# Patient Record
Sex: Female | Born: 2000 | Race: White | Hispanic: No | Marital: Single | Smoking: Never smoker
Health system: Southern US, Community
[De-identification: ages and names within clinical notes are randomized; demographics above are authoritative.]

## PROBLEM LIST (undated history)

## (undated) ENCOUNTER — Ambulatory Visit (HOSPITAL_COMMUNITY)

## (undated) DIAGNOSIS — N390 Urinary tract infection, site not specified: Secondary | ICD-10-CM

## (undated) DIAGNOSIS — J302 Other seasonal allergic rhinitis: Secondary | ICD-10-CM

## (undated) DIAGNOSIS — N83209 Unspecified ovarian cyst, unspecified side: Secondary | ICD-10-CM

## (undated) DIAGNOSIS — R519 Headache, unspecified: Secondary | ICD-10-CM

## (undated) DIAGNOSIS — F32A Depression, unspecified: Secondary | ICD-10-CM

## (undated) DIAGNOSIS — G43909 Migraine, unspecified, not intractable, without status migrainosus: Secondary | ICD-10-CM

## (undated) HISTORY — DX: Headache, unspecified: R51.9

## (undated) HISTORY — DX: Urinary tract infection, site not specified: N39.0

## (undated) HISTORY — PX: TONSILLECTOMY: SUR1361

## (undated) HISTORY — DX: Migraine, unspecified, not intractable, without status migrainosus: G43.909

## (undated) HISTORY — DX: Depression, unspecified: F32.A

---
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
## 2021-02-23 IMAGING — US US PELVIS COMPLETE WITH TRANSVAGINAL
1 series · 13 of 25 positions shown · non-contrast
Comparison: None

CLINICAL DATA: Heavy vaginal bleeding.



[Series 1: us pelvis complete with transvaginal · 13 of 83 slices shown]
[im 1/83]
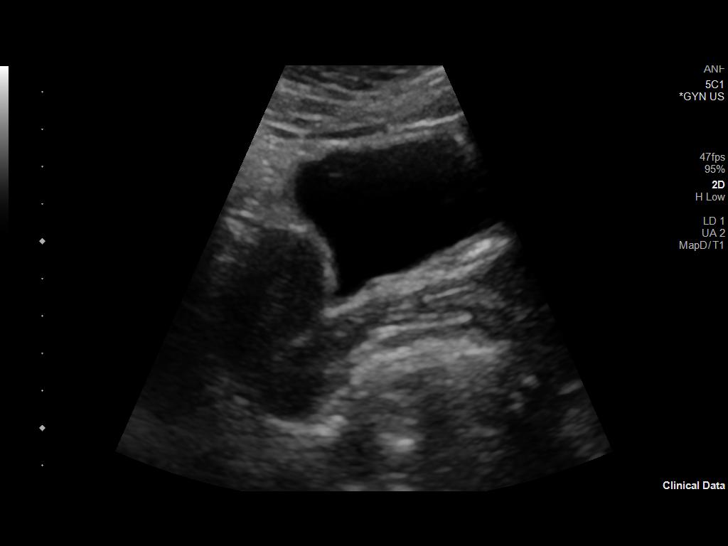
[im 7/83]
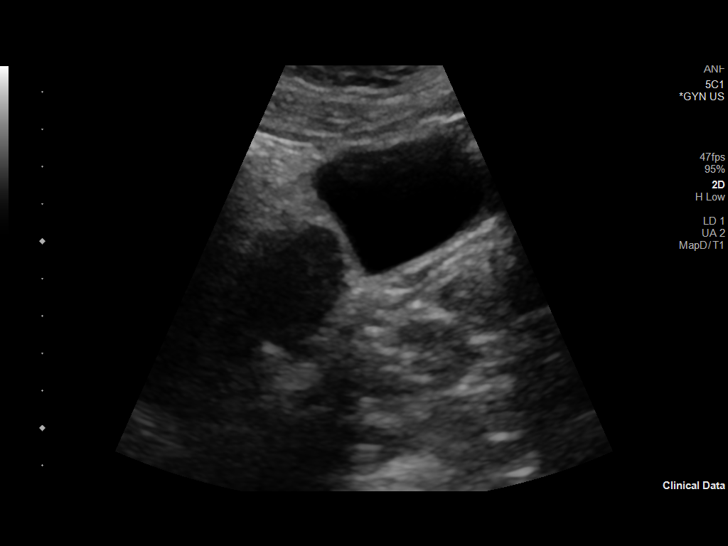
[im 14/83]
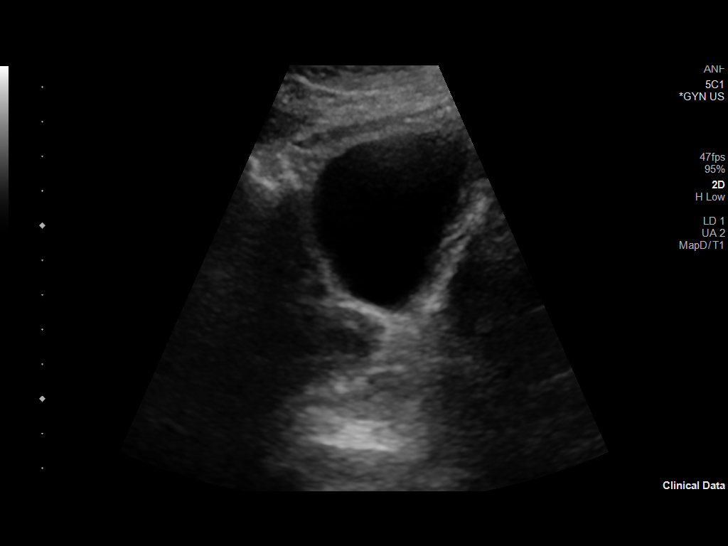
[im 21/83]
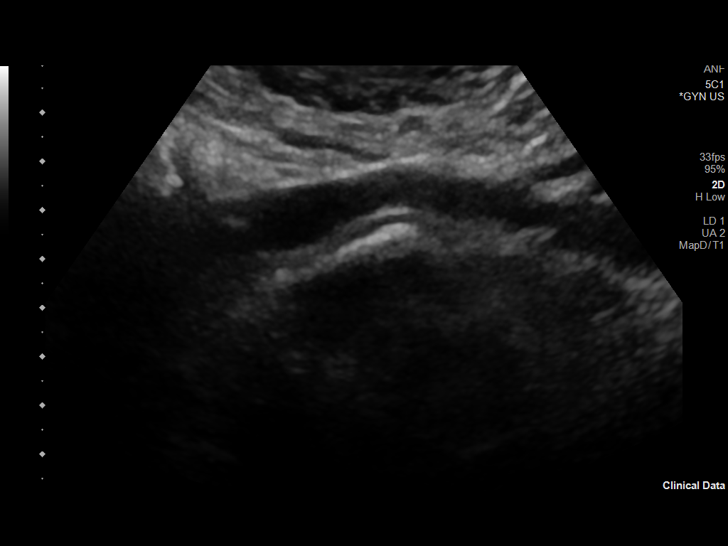
[im 28/83]
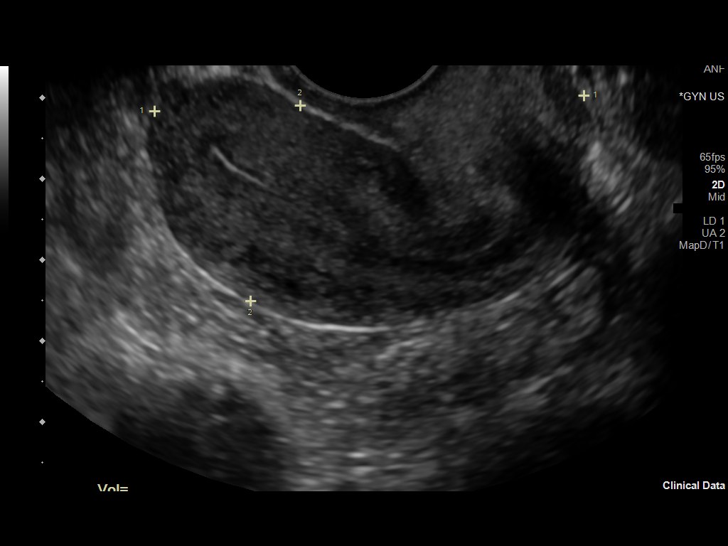
[im 35/83]
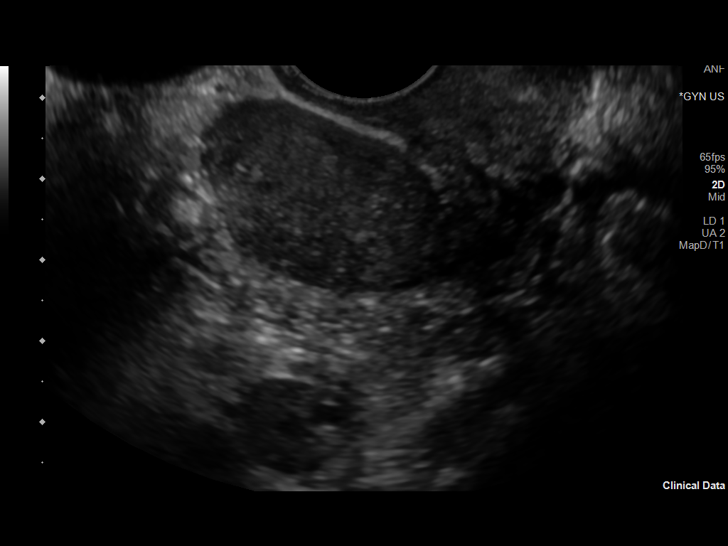
[im 42/83]
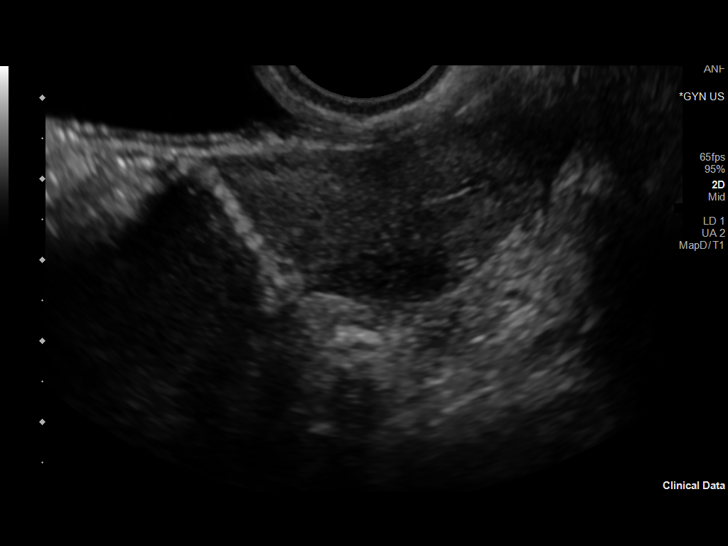
[im 48/83]
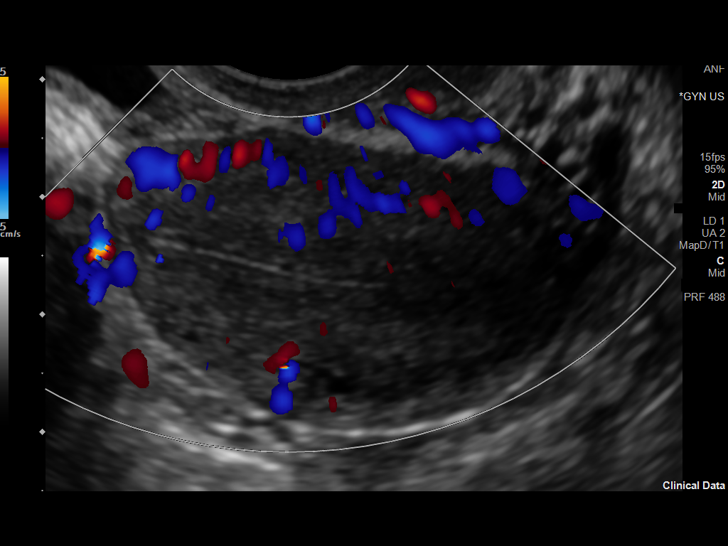
[im 55/83]
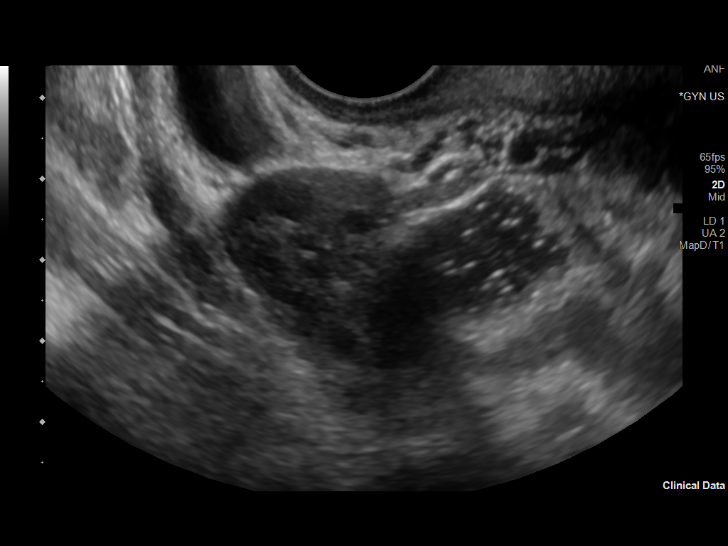
[im 62/83]
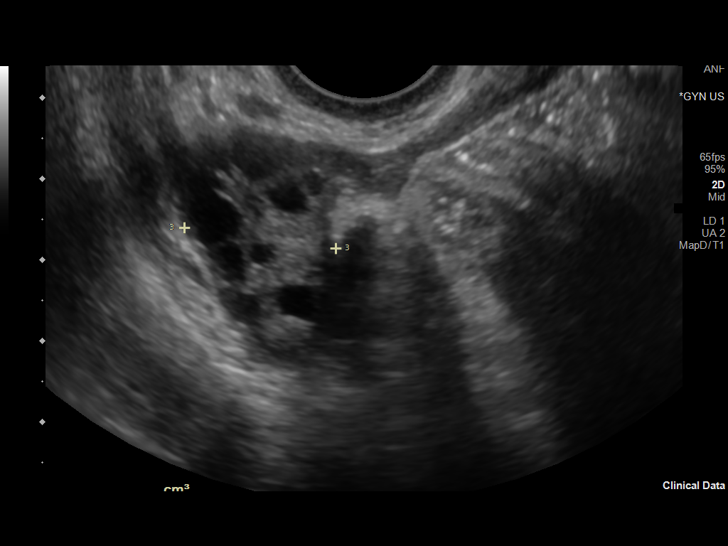
[im 69/83]
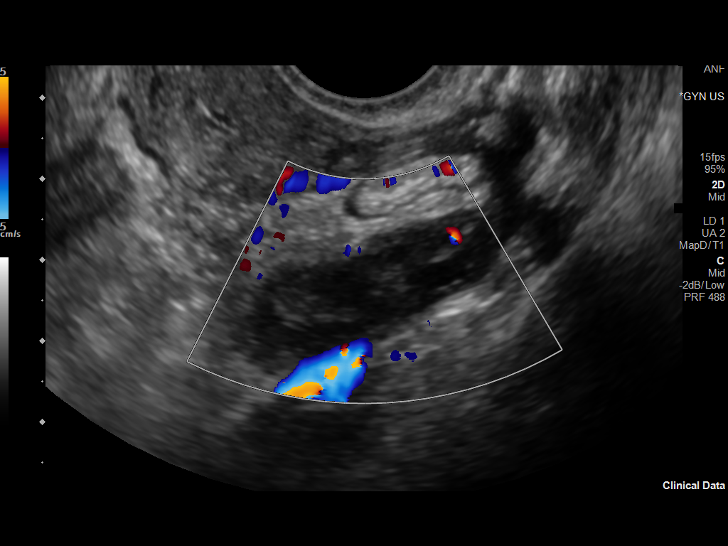
[im 76/83]
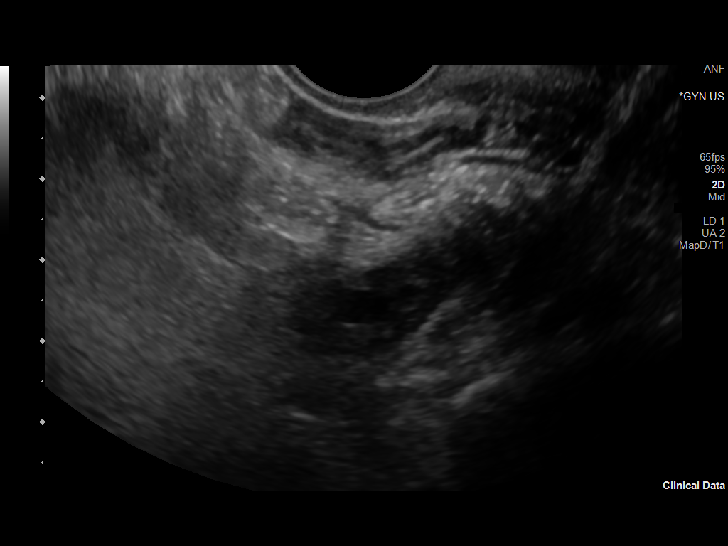
[im 83/83]
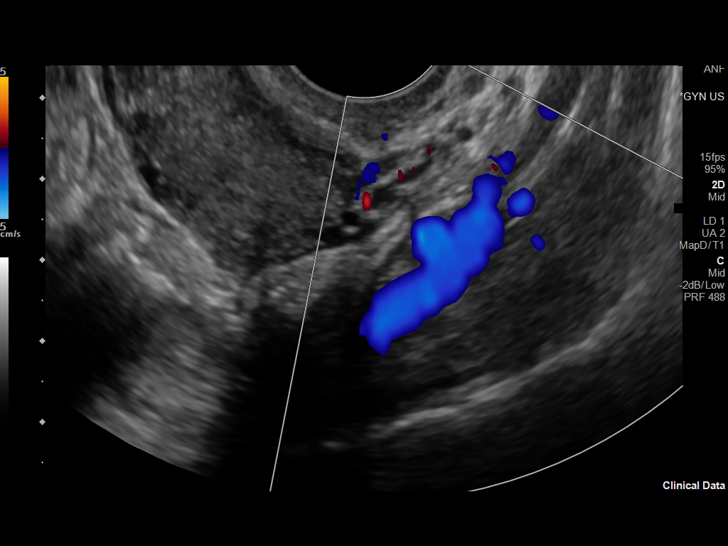

[13 of 25 positions shown; findings below may reference images not displayed]

FINDINGS: Uterus

Measurements: 5.3 x 2.5 x 4.2 cm = volume: 28.8 mL. No fibroids or
other mass visualized.

Endometrium

Thickness: 1 mm.  No focal abnormality visualized.

Right ovary

Measurements: 3.4 x 2.2 x 1.9 cm = volume: 7.5 mL. Normal
appearance/no adnexal mass.

Left ovary

Measurements: 3.8 x 1.1 x 2.0 cm = volume: 4.2 mL. Normal
appearance/no adnexal mass.

Other findings

No abnormal free fluid.
IMPRESSION: Endometrium measures 1 mm. If bleeding remains unresponsive to
hormonal or medical therapy, sonohysterogram should be considered
for focal lesion work-up. (Ref: Radiological Reasoning: Algorithmic
Workup of Abnormal Vaginal Bleeding with Endovaginal Sonography and
Sonohysterography. [id number]; [id number]:[id number])

## 2021-10-10 DIAGNOSIS — H5213 Myopia, bilateral: Secondary | ICD-10-CM | POA: Diagnosis not present

## 2021-10-11 ENCOUNTER — Encounter: Payer: Self-pay | Admitting: Family

## 2021-10-11 ENCOUNTER — Ambulatory Visit (INDEPENDENT_AMBULATORY_CARE_PROVIDER_SITE_OTHER): Payer: Medicaid Other | Admitting: Family

## 2021-10-11 VITALS — BP 108/64 | HR 87 | Temp 97.9°F | Resp 16 | Ht 62.0 in | Wt 138.0 lb

## 2021-10-11 DIAGNOSIS — Z13220 Encounter for screening for lipoid disorders: Secondary | ICD-10-CM | POA: Insufficient documentation

## 2021-10-11 DIAGNOSIS — F4323 Adjustment disorder with mixed anxiety and depressed mood: Secondary | ICD-10-CM

## 2021-10-11 DIAGNOSIS — N946 Dysmenorrhea, unspecified: Secondary | ICD-10-CM | POA: Insufficient documentation

## 2021-10-11 DIAGNOSIS — D509 Iron deficiency anemia, unspecified: Secondary | ICD-10-CM | POA: Insufficient documentation

## 2021-10-11 DIAGNOSIS — R519 Headache, unspecified: Secondary | ICD-10-CM

## 2021-10-11 DIAGNOSIS — N921 Excessive and frequent menstruation with irregular cycle: Secondary | ICD-10-CM | POA: Insufficient documentation

## 2021-10-11 DIAGNOSIS — F319 Bipolar disorder, unspecified: Secondary | ICD-10-CM | POA: Diagnosis not present

## 2021-10-11 DIAGNOSIS — R5383 Other fatigue: Secondary | ICD-10-CM

## 2021-10-11 DIAGNOSIS — B079 Viral wart, unspecified: Secondary | ICD-10-CM

## 2021-10-11 DIAGNOSIS — D500 Iron deficiency anemia secondary to blood loss (chronic): Secondary | ICD-10-CM | POA: Diagnosis not present

## 2021-10-11 LAB — CBC WITH DIFFERENTIAL/PLATELET
Basophils Absolute: 0 10*3/uL (ref 0.0–0.1)
Basophils Relative: 0.5 % (ref 0.0–3.0)
Eosinophils Absolute: 0 10*3/uL (ref 0.0–0.7)
Eosinophils Relative: 0.8 % (ref 0.0–5.0)
HCT: 36.4 % (ref 36.0–49.0)
Hemoglobin: 12.2 g/dL (ref 12.0–16.0)
Lymphocytes Relative: 35.8 % (ref 24.0–48.0)
Lymphs Abs: 2.2 10*3/uL (ref 0.7–4.0)
MCHC: 33.5 g/dL (ref 31.0–37.0)
MCV: 89.7 fl (ref 78.0–98.0)
Monocytes Absolute: 0.6 10*3/uL (ref 0.1–1.0)
Monocytes Relative: 9 % (ref 3.0–12.0)
Neutro Abs: 3.4 10*3/uL (ref 1.4–7.7)
Neutrophils Relative %: 53.9 % (ref 43.0–71.0)
Platelets: 267 10*3/uL (ref 150.0–575.0)
RBC: 4.06 Mil/uL (ref 3.80–5.70)
RDW: 14.7 % (ref 11.4–15.5)
WBC: 6.3 10*3/uL (ref 4.5–13.5)

## 2021-10-11 LAB — IBC + FERRITIN
Ferritin: 8.5 ng/mL — ABNORMAL LOW (ref 10.0–291.0)
Iron: 86 ug/dL (ref 42–145)
Saturation Ratios: 18.6 % — ABNORMAL LOW (ref 20.0–50.0)
TIBC: 462 ug/dL — ABNORMAL HIGH (ref 250.0–450.0)
Transferrin: 330 mg/dL (ref 212.0–360.0)

## 2021-10-11 LAB — TSH: TSH: 2.37 u[IU]/mL (ref 0.40–5.00)

## 2021-10-11 LAB — LIPID PANEL
Cholesterol: 168 mg/dL (ref 0–200)
HDL: 67.9 mg/dL (ref >39.00)
LDL Cholesterol: 89 mg/dL (ref 0–99)
NonHDL: 99.83
Total CHOL/HDL Ratio: 2
Triglycerides: 54 mg/dL (ref 0.0–149.0)
VLDL: 10.8 mg/dL (ref 0.0–40.0)

## 2021-10-11 LAB — COMPREHENSIVE METABOLIC PANEL
ALT: 16 U/L (ref 0–35)
AST: 20 U/L (ref 0–37)
Albumin: 4.7 g/dL (ref 3.5–5.2)
Alkaline Phosphatase: 47 U/L (ref 47–119)
BUN: 9 mg/dL (ref 6–23)
CO2: 26 mEq/L (ref 19–32)
Calcium: 9.8 mg/dL (ref 8.4–10.5)
Chloride: 105 mEq/L (ref 96–112)
Creatinine, Ser: 0.7 mg/dL (ref 0.40–1.20)
GFR: 124.89 mL/min (ref >60.00)
Glucose, Bld: 89 mg/dL (ref 70–99)
Potassium: 4.1 mEq/L (ref 3.5–5.1)
Sodium: 138 mEq/L (ref 135–145)
Total Bilirubin: 0.4 mg/dL (ref 0.2–1.2)
Total Protein: 7.1 g/dL (ref 6.0–8.3)

## 2021-10-11 LAB — SEDIMENTATION RATE: Sed Rate: 2 mm/hr (ref 0–20)

## 2021-10-11 LAB — B12 AND FOLATE PANEL
Folate: 13 ng/mL (ref >5.9)
Vitamin B-12: 350 pg/mL (ref 211–911)

## 2021-10-11 LAB — C-REACTIVE PROTEIN: CRP: <1 mg/dL (ref 0.5–20.0)

## 2021-10-11 MED ORDER — HYDROXYZINE HCL 10 MG PO TABS: 10.0000 mg | ORAL_TABLET | Freq: Three times a day (TID) | ORAL | 0 refills | Status: DC | PRN

## 2021-10-11 MED ORDER — FLUOXETINE HCL 20 MG PO CAPS: 20.0000 mg | ORAL_CAPSULE | Freq: Every day | ORAL | 2 refills | Status: DC

## 2021-10-11 MED ORDER — FLUOXETINE HCL 10 MG PO CAPS: 20.0000 mg | ORAL_CAPSULE | Freq: Every day | ORAL | 0 refills | Status: DC

## 2021-10-11 NOTE — Assessment & Plan Note (Signed)
Lipid panel ordered pending results.   

## 2021-10-11 NOTE — Assessment & Plan Note (Signed)
Fatigue workup with labs pending results 

## 2021-10-11 NOTE — Progress Notes (Signed)
New Patient Office Visit  Subjective:  Patient ID: Teresa Rogers, female    DOB: [birth year]  Age: 21 y.o. MRN: [id number]  CC:  Chief Complaint  Patient presents with   Establish Care    HPI Teresa Rogers is here to establish care as a new patient.  Prior provider was: Ruben Gottron, 2020  Pt is without acute concerns.   chronic concerns:  Depression,adjustment disorder: prozac 20 mg, stopped this, stopped around December 2022. She felt numb while taking this, didn't 'feel' anything. Was not suicidal denies SI HI  Had been going to therapy, no longer going. Had a lapse with treatment due to a therapist leaving and she never got rescheduled. She does feel this was helping.   Was using hydroxyxine in the past for anxiety attacks was helpful  Gets warts on bil hands, has been using otc treatment for this. Wants to get treated. Do not itch.   Headaches, only drinks water at night. Has poor eating habits will eat at times and then finds she doesn't eat for a few days. Drinks a lot of dr pepper as well.   Past Medical History:  Diagnosis Date   Depression    Frequent headaches    Migraines    Ovarian cyst    Seasonal allergies    Urinary tract infection    UTI (urinary tract infection)     Past Surgical History:  Procedure Laterality Date   TONSILLECTOMY      Family History  Problem Relation Age of Onset   Depression Mother    Bipolar disorder Father    Depression Father    Alcohol abuse Father        in rehab   Depression Maternal Grandmother        Suicide attempt with zoloft   Cancer Maternal Grandfather        unsure which   Cancer Paternal Grandfather        unsure which    Social History   Socioeconomic History   Marital status: Single    Spouse name: Not on file   Number of children: Not on file   Years of education: Not on file   Highest education level: Not on file  Occupational History   Occupation: [employer]    Comment: working right now   Tobacco Use   Smoking status: Never   Smokeless tobacco: Never  Vaping Use   Vaping Use: Every day   Start date: 10/08/2018   Substances: Nicotine  Substance and Sexual Activity   Alcohol use: Not Currently   Drug use: Never   Sexual activity: Yes    Partners: Male    Birth control/protection: Condom    Comment: same partner since 8th grade  Other Topics Concern   Not on file  Social History Narrative   Several half-siblings- 2 on mom's side, 5 on dad's side   Boyfriend's mom has legal guardianship      Not a good relationship with parents    Social Determinants of Health   Financial Resource Strain: Not on file  Food Insecurity: Not on file  Transportation Needs: Not on file  Physical Activity: Not on file  Stress: Not on file  Social Connections: Not on file  Intimate Partner Violence: Not on file    Outpatient Medications Prior to Visit  Medication Sig Dispense Refill   FLUoxetine (PROZAC) 20 MG capsule Take 1 capsule (20 mg total) by mouth daily. 30 capsule 2   hydrOXYzine (  ATARAX/VISTARIL) 10 MG tablet Take 1 tablet (10 mg total) by mouth 3 (three) times daily as needed for anxiety or nausea. (Patient not taking: Reported on 10/11/2021) 30 tablet 0   ibuprofen (ADVIL) 200 MG tablet Take 400 mg by mouth daily as needed for headache or cramping. (Patient not taking: Reported on 10/11/2021)     norgestimate-ethinyl estradiol (SPRINTEC 28) 0.25-35 MG-MCG tablet TUD (Patient not taking: Reported on 10/11/2021) 1 Package 11   ondansetron (ZOFRAN ODT) 4 MG disintegrating tablet Take 1 tablet (4 mg total) by mouth every 8 (eight) hours as needed for nausea or vomiting. (Patient not taking: Reported on 10/11/2021) 10 tablet 0   polyethylene glycol powder (GLYCOLAX/MIRALAX) 17 GM/SCOOP powder Take 17 g by mouth daily. Mix with 8oz of liquid with 17g miralax and take once daily. (Patient not taking: Reported on 02/23/2019) 500 g 0   No facility-administered medications prior to  visit.    Allergies  Allergen Reactions   Depo-Provera [Medroxyprogesterone Acetate] Other (See Comments)    Heavy periods heavier than usual causing anemia   Latex Rash        Objective:    Physical Exam  Gen: NAD, resting comfortably CV: RRR with no murmurs appreciated Pulm: NWOB, CTAB with no crackles, wheezes, or rhonchi Skin: warm, dry Psych: Normal affect and thought content  BP 108/64   Pulse 87   Temp 97.9 F (36.6 C)   Resp 16   Ht 5\' 2"  (1.575 m)   Wt 138 lb (62.6 kg)   LMP 10/10/2021   SpO2 97%   BMI 25.24 kg/m  Wt Readings from Last 3 Encounters:  10/11/21 138 lb (62.6 kg) (66 %, Z= 0.41)*  02/23/19 115 lb 11.9 oz (52.5 kg) (36 %, Z= -0.37)*  08/26/18 110 lb 6.4 oz (50.1 kg) (27 %, Z= -0.63)*   * Growth percentiles are based on CDC (Girls, 2-20 Years) data.     Health Maintenance Due  Topic Date Due   HPV VACCINES (2 - 3-dose series) 05/17/2018   Hepatitis C Screening  Never done   CHLAMYDIA SCREENING  02/23/2020       Topic Date Due   HPV VACCINES (2 - 3-dose series) 05/17/2018    No results found for: "TSH" Lab Results  Component Value Date   WBC 5.3 02/23/2019   HGB 12.8 02/23/2019   HCT 39.0 02/23/2019   MCV 94.7 02/23/2019   PLT 283 02/23/2019   Lab Results  Component Value Date   NA 141 08/26/2018   K 4.1 08/26/2018   CO2 25 08/26/2018   GLUCOSE 75 08/26/2018   BUN 7 08/26/2018   CREATININE 0.70 08/26/2018   BILITOT 0.4 08/26/2018   AST 20 08/26/2018   ALT 11 08/26/2018   PROT 7.4 08/26/2018   CALCIUM 10.2 08/26/2018   No results found for: "CHOL" No results found for: "HDL" No results found for: "LDLCALC" No results found for: "TRIG" No results found for: "CHOLHDL" No results found for: "HGBA1C"    Assessment & Plan:   Problem List Items Addressed This Visit       Musculoskeletal and Integument   Wart of hand    Referred to dermatology Likely will need frozen off      Relevant Orders   Ambulatory  referral to Dermatology     Genitourinary   Painful menstrual periods    Has tried ocps didn't like this method Can take ibuprofen prior to onset to see if this helps Ordering cbc  to assess for anemia      Relevant Orders   TSH     Other   Menorrhagia with irregular cycle    May consider workup for pcos in near future  Pending results of tsh and cbc      Iron deficiency anemia due to chronic blood loss    ibc ferritin and cbc ordered pending results Likely result of heavier than normal periods      Relevant Orders   B12 and Folate Panel   IBC + Ferritin   CBC with Differential   Other fatigue    Fatigue workup with labs pending results      Relevant Orders   TSH   B12 and Folate Panel   IBC + Ferritin   CBC with Differential   Comprehensive metabolic panel   Intractable episodic headache    Likely r/t diet and lack of water  Increase oral water intake to 4-6 bottles a day  Decrease soda intake and caffeine if taking.  Try to eat every 2-3 hours don't skip meals.  Keep a headache diary, avoid triggers as able.  Ordering tsh cbc cmp crp and sed rate      Relevant Medications   FLUoxetine (PROZAC) 10 MG capsule   Other Relevant Orders   C-reactive protein   Sedimentation rate   Adjustment disorder with mixed anxiety and depressed mood    Will again try prozac 10 mg once daily  Handout given for therapy, also referred pt to psychiatry as she feels she may have bipolar components  Work on anxiety reducing techniques  rx hydroxyzine 50 mg prn  Refill given  I instructed pt to start prozac 10 mg 1/2 tablet once daily for 1 week and then increase to a full tablet once daily on week two as tolerated.  We discussed common side effects such as nausea, drowsiness and weight gain.  Also discussed rare but serious side effect of suicidal ideation.  She is instructed to discontinue medication and go directly to ED if this occurs.  Pt verbalizes understanding.  Plan is to  follow up in 30 days to evaluate progress.          Relevant Medications   hydrOXYzine (ATARAX) 10 MG tablet   FLUoxetine (PROZAC) 10 MG capsule   Other Relevant Orders   TSH   Ambulatory referral to Psychiatry   Screening for lipoid disorders    Lipid panel ordered pending results.        Relevant Orders   Lipid panel   RESOLVED: Bipolar disorder with depression (HCC) - Primary    Meds ordered this encounter  Medications   hydrOXYzine (ATARAX) 10 MG tablet    Sig: Take 1 tablet (10 mg total) by mouth 3 (three) times daily as needed for anxiety or nausea.    Dispense:  30 tablet    Refill:  0    Order Specific Question:   Supervising Provider    Answer:   BEDSOLE, AMY E [[id number]]   DISCONTD: FLUoxetine (PROZAC) 20 MG capsule    Sig: Take 1 capsule (20 mg total) by mouth daily.    Dispense:  30 capsule    Refill:  2    Order Specific Question:   Supervising Provider    Answer:   BEDSOLE, AMY E [[id number]]   FLUoxetine (PROZAC) 10 MG capsule    Sig: Take 2 capsules (20 mg total) by mouth daily.    Dispense:  120 capsule    Refill:  0    Order Specific Question:   Supervising Provider    Answer:   Ermalene Searing AMY E [[id number]]    Follow-up: Return in about 1 month (around 11/11/2021) for regular follow up for medication .    Mort Sawyers, FNP

## 2021-10-11 NOTE — Assessment & Plan Note (Signed)
May consider workup for pcos in near future  Pending results of tsh and cbc

## 2021-10-11 NOTE — Assessment & Plan Note (Signed)
Will again try prozac 10 mg once daily  Handout given for therapy, also referred pt to psychiatry as she feels she may have bipolar components  Work on anxiety reducing techniques  rx hydroxyzine 50 mg prn  Refill given  I instructed pt to start prozac 10 mg 1/2 tablet once daily for 1 week and then increase to a full tablet once daily on week two as tolerated.  We discussed common side effects such as nausea, drowsiness and weight gain.  Also discussed rare but serious side effect of suicidal ideation.  She is instructed to discontinue medication and go directly to ED if this occurs.  Pt verbalizes understanding.  Plan is to follow up in 30 days to evaluate progress.

## 2021-10-11 NOTE — Assessment & Plan Note (Signed)
ibc ferritin and cbc ordered pending results Likely result of heavier than normal periods

## 2021-10-11 NOTE — Patient Instructions (Addendum)
  Welcome to our clinic, I am happy to have you as my new patient. I am excited to continue on this healthcare journey with you.  Stop by the lab prior to leaving today. I will notify you of your results once received.   Start prozac 10 mg for anxiety and depression. Take 1/2 tablet by mouth once daily for about one week, then increase to 1 full tablet thereafter.   Taking the medicine as directed and not missing any doses is one of the best things you can do to treat your anxiety/depression.  Here are some things to keep in mind:  Side effects (stomach upset, some increased anxiety) may happen before you notice a benefit.  These side effects typically go away over time. Changes to your dose of medicine or a change in medication all together is sometimes necessary Many people will notice an improvement within two weeks but the full effect of the medication can take up to 4-6 weeks Stopping the medication when you start feeling better often results in a return of symptoms. Most people need to be on medication at least 6-12 months If you start having thoughts of hurting yourself or others after starting this medicine, please call me immediately.     Please keep in mind Any my chart messages you send have up to a three business day turnaround for a response.  Phone calls may take up to a one full business day turnaround for a  response.   If you need a medication refill I recommend you request it through the pharmacy as this is easiest for [place] rather than sending a message and or phone call.   Due to recent changes in healthcare laws, you may see results of your imaging and/or laboratory studies on MyChart before I have had a chance to review them.  I understand that in some cases there may be results that are confusing or concerning to you. Please understand that not all results are received at the same time and often I may need to interpret multiple results in order to provide you with the best  plan of care or course of treatment. Therefore, I ask that you please give me 2 business days to thoroughly review all your results before contacting my office for clarification. Should we see a critical lab result, you will be contacted sooner.   It was a pleasure seeing you today! Please do not hesitate to reach out with any questions and or concerns.  Regards,   Mort Sawyers FNP-C

## 2021-10-11 NOTE — Assessment & Plan Note (Signed)
Likely r/t diet and lack of water  Increase oral water intake to 4-6 bottles a day  Decrease soda intake and caffeine if taking.  Try to eat every 2-3 hours don't skip meals.  Keep a headache diary, avoid triggers as able.  Ordering tsh cbc cmp crp and sed rate

## 2021-10-11 NOTE — Assessment & Plan Note (Signed)
Has tried ocps didn't like this method Can take ibuprofen prior to onset to see if this helps Ordering cbc to assess for anemia

## 2021-10-11 NOTE — Assessment & Plan Note (Signed)
Referred to dermatology Likely will need frozen off

## 2021-10-17 NOTE — Progress Notes (Signed)
Patent did not view their my chart test result that I responded to one week ago.  Please call them and advise them of the below results.

## 2022-03-06 ENCOUNTER — Ambulatory Visit: Payer: Medicaid Other | Admitting: Family

## 2022-03-08 ENCOUNTER — Encounter: Payer: Self-pay | Admitting: Primary Care

## 2022-03-08 ENCOUNTER — Ambulatory Visit (INDEPENDENT_AMBULATORY_CARE_PROVIDER_SITE_OTHER): Payer: Medicaid Other | Admitting: Primary Care

## 2022-03-08 VITALS — BP 126/82 | HR 90 | Temp 98.1°F | Ht 62.0 in | Wt 146.0 lb

## 2022-03-08 DIAGNOSIS — F4323 Adjustment disorder with mixed anxiety and depressed mood: Secondary | ICD-10-CM

## 2022-03-08 DIAGNOSIS — N921 Excessive and frequent menstruation with irregular cycle: Secondary | ICD-10-CM

## 2022-03-08 DIAGNOSIS — N946 Dysmenorrhea, unspecified: Secondary | ICD-10-CM | POA: Diagnosis not present

## 2022-03-08 DIAGNOSIS — D500 Iron deficiency anemia secondary to blood loss (chronic): Secondary | ICD-10-CM

## 2022-03-08 DIAGNOSIS — Z3201 Encounter for pregnancy test, result positive: Secondary | ICD-10-CM

## 2022-03-08 LAB — POCT URINE PREGNANCY: Preg Test, Ur: POSITIVE — AB

## 2022-03-08 NOTE — Assessment & Plan Note (Addendum)
Repeat iron studies and CBC pending.  

## 2022-03-08 NOTE — Assessment & Plan Note (Addendum)
Uncontrolled.  Fluoxetine with side effects of drowsiness. Initial plan was to initiate sertraline 25 mg daily, however positive pregnancy test in the office today.  We discussed that sertraline was the safest of all SSRIs to begin during pregnancy. After further discussion, she declined any treatment for anxiety/depression at this time.  Close follow-up with PCP.

## 2022-03-08 NOTE — Assessment & Plan Note (Addendum)
Initial plan was to start OCPs; however, Urine pregnancy test positive with two tests in the office today. Discussed this with patient.  See notes under positive pregnancy test.  Will collect HCG quantitative pregnancy labs.  Discussed to avoid other medications for now.  Will not be prescribing OCPs.

## 2022-03-08 NOTE — Assessment & Plan Note (Addendum)
Initial plan was to start OCPs; however, Urine pregnancy test positive with two tests in the office today. Discussed this with patient.  See notes under positive pregnancy test.  Will collect HCG quantitative pregnancy labs.  Discussed to avoid other medications for now.  Will not be prescribing OCPs. 

## 2022-03-08 NOTE — Patient Instructions (Signed)
Please go to [facility] today as discussed.  Please let me know if you need a referral to the OB/GYN.  Please notify me if you experience vaginal bleeding, increased pelvic cramping.  It was a pleasure meeting you!

## 2022-03-08 NOTE — Progress Notes (Signed)
Subjective:    Patient ID: Chessie Neuharth, female    DOB: 01/09/2001, 21 y.o.   MRN: [id number]  Depression         Sherrine Salberg is a very pleasant 21 y.o. female patient of Mort Sawyers with a history of anxiety/depression and menorrhagia with irregular cycle who presents today to discuss menorrhagia and anxiety.  1) Anxiety and Depression: Chronic history. She was last evaluated in July 2023 when she established care with Mort Sawyers, NP. During this visit she was re-initiated on fluoxetine at the 10 mg daily dose for anxiety and depression, and hydroxyzine 50 mg PRN for anxiety. She was once managed on fluoxetine 20 mg but felt "numb" at this dose.   Since her last visit she stopped taking her fluoxetine. She didn't refill her Rx after her bottle ran out as fluoxetine caused her to feel drowsy. She's not sure if the fluoxetine helped for anxiety symptoms. She has been taking hydroxyzine 10 mg daily in the morning which does not cause drowsiness.  Her symptoms are active which include feeling sad, fatigued all the time. Also with symptoms of worrying with worst case scenario thoughts, feeling overwhelmed when around other people. She does better when she's alone and away from others.   She is open to trying a different type of medication for her symptoms.  2) Menorrhagia with irregular Cycle/Dysmenorrhea: Evaluated in July 2023 by PCP, labs collected for anemia given her symptoms which revealed iron deficiency anemia. She was advised to start oral iron daily.   Chronic since 2020, previously managed on Depo Provera but this made her symptoms worse. She would saturate super plus tampons every 20 minutes. She stopped Depo Provera and her symptoms did improve.  Her typical menses is 7 days, for the first 3 days she has intense cramping. She will experience menorrhagia for 7 days, but lighter days for about 4 days. During her heavier days has to change her tampon every 1.5 hours compared every  2 hours with lighter days.  She interested in trying birth control pills. LMP was in mid November, doesn't recall the date. She is sexually active. She is taking oral iron once daily.    Review of Systems  Respiratory:  Positive for chest tightness. Negative for shortness of breath.        With anxiety  Cardiovascular:  Positive for palpitations. Negative for chest pain.       With anxiety   Genitourinary:  Positive for menstrual problem.  Neurological:  Negative for dizziness.  Psychiatric/Behavioral:  Positive for depression. Negative for sleep disturbance. The patient is nervous/anxious.          Past Medical History:  Diagnosis Date   Depression    Frequent headaches    Migraines    Ovarian cyst    Seasonal allergies    Urinary tract infection    UTI (urinary tract infection)     Social History   Socioeconomic History   Marital status: Single    Spouse name: Not on file   Number of children: Not on file   Years of education: Not on file   Highest education level: Not on file  Occupational History   Occupation: [employer]    Comment: working right now  Tobacco Use   Smoking status: Never   Smokeless tobacco: Never  Vaping Use   Vaping Use: Every day   Start date: 10/08/2018   Substances: Nicotine  Substance and Sexual Activity   Alcohol use:  Not Currently   Drug use: Never   Sexual activity: Yes    Partners: Male    Birth control/protection: Condom    Comment: same partner since 8th grade  Other Topics Concern   Not on file  Social History Narrative   Several half-siblings- 2 on mom's side, 5 on dad's side   Boyfriend's mom has legal guardianship      Not a good relationship with parents    Social Determinants of Health   Financial Resource Strain: Not on file  Food Insecurity: Not on file  Transportation Needs: Not on file  Physical Activity: Not on file  Stress: Not on file  Social Connections: Not on file  Intimate Partner Violence: Not on  file    Past Surgical History:  Procedure Laterality Date   TONSILLECTOMY      Family History  Problem Relation Age of Onset   Depression Mother    Bipolar disorder Father    Depression Father    Alcohol abuse Father        in rehab   Depression Maternal Grandmother        Suicide attempt with zoloft   Cancer Maternal Grandfather        unsure which   Cancer Paternal Grandfather        unsure which    Allergies  Allergen Reactions   Depo-Provera [Medroxyprogesterone Acetate] Other (See Comments)    Heavy periods heavier than usual causing anemia   Latex Rash    Current Outpatient Medications on File Prior to Visit  Medication Sig Dispense Refill   hydrOXYzine (ATARAX) 10 MG tablet Take 1 tablet (10 mg total) by mouth 3 (three) times daily as needed for anxiety or nausea. 30 tablet 0   No current facility-administered medications on file prior to visit.    BP 126/82   Pulse 90   Temp 98.1 F (36.7 C) (Temporal)   Ht 5\' 2"  (1.575 m)   Wt 146 lb (66.2 kg)   SpO2 99%   BMI 26.70 kg/m  Objective:   Physical Exam Cardiovascular:     Rate and Rhythm: Normal rate and regular rhythm.  Pulmonary:     Effort: Pulmonary effort is normal.     Breath sounds: Normal breath sounds.  Musculoskeletal:     Cervical back: Neck supple.  Skin:    General: Skin is warm and dry.  Psychiatric:        Mood and Affect: Mood normal.           Assessment & Plan:   Problem List Items Addressed This Visit       Genitourinary   Dysmenorrhea - Primary    Initial plan was to start OCPs; however, Urine pregnancy test positive with two tests in the office today. Discussed this with patient.  See notes under positive pregnancy test.  Will collect HCG quantitative pregnancy labs.  Discussed to avoid other medications for now.  Will not be prescribing OCPs.      Relevant Orders   POCT urine pregnancy (Completed)   CBC   Iron and TIBC   Ferritin     Other   Menorrhagia  with irregular cycle    Initial plan was to start OCPs; however, Urine pregnancy test positive with two tests in the office today. Discussed this with patient.  See notes under positive pregnancy test.  Will collect HCG quantitative pregnancy labs.  Discussed to avoid other medications for now.  Will not be prescribing OCPs.  Relevant Orders   POCT urine pregnancy (Completed)   CBC   Iron and TIBC   Ferritin   Iron deficiency anemia    Repeat iron studies and CBC pending.      Relevant Orders   IBC + Ferritin   CBC   CBC   Iron and TIBC   Ferritin   Adjustment disorder with mixed anxiety and depressed mood    Uncontrolled.  Fluoxetine with side effects of drowsiness. Initial plan was to initiate sertraline 25 mg daily, however positive pregnancy test in the office today.  We discussed that sertraline was the safest of all SSRIs to begin during pregnancy. After further discussion, she declined any treatment for anxiety/depression at this time.  Close follow-up with PCP.      Positive urine pregnancy test    Verified with 2 pregnancy test in the office.  Discussed results with patient.  After further discussion she does mention breast tenderness, pelvic cramping.  She also mentions that she "slipped up" 2 weeks ago by having unprotected intercourse.  She took a Plan B pill the morning after.  She denied breakthrough vaginal bleeding since menstrual cycle in mid November.  Checking hCG quantitative labs. We discussed the risks for ectopic pregnancy/miscarriage given her recent Plan B pill. I recommended she establish with OB/GYN, will place referral if needed.      Relevant Orders   hCG, quantitative, pregnancy   Beta hCG quant (ref lab)       Doreene Nest, NP

## 2022-03-08 NOTE — Assessment & Plan Note (Signed)
Verified with 2 pregnancy test in the office.  Discussed results with patient.  After further discussion she does mention breast tenderness, pelvic cramping.  She also mentions that she "slipped up" 2 weeks ago by having unprotected intercourse.  She took a Plan B pill the morning after.  She denied breakthrough vaginal bleeding since menstrual cycle in mid November.  Checking hCG quantitative labs. We discussed the risks for ectopic pregnancy/miscarriage given her recent Plan B pill. I recommended she establish with OB/GYN, will place referral if needed.

## 2022-03-09 DIAGNOSIS — Z3201 Encounter for pregnancy test, result positive: Secondary | ICD-10-CM

## 2022-03-24 ENCOUNTER — Emergency Department
Admission: EM | Admit: 2022-03-24 | Discharge: 2022-03-24 | Discharge status: Against Medical Advice | Payer: Medicaid Other | Attending: Emergency Medicine | Admitting: Emergency Medicine

## 2022-03-24 ENCOUNTER — Emergency Department: Payer: Medicaid Other

## 2022-03-24 ENCOUNTER — Other Ambulatory Visit: Payer: Self-pay

## 2022-03-24 DIAGNOSIS — O26859 Spotting complicating pregnancy, unspecified trimester: Secondary | ICD-10-CM | POA: Diagnosis present

## 2022-03-24 DIAGNOSIS — Z3A01 Less than 8 weeks gestation of pregnancy: Secondary | ICD-10-CM | POA: Diagnosis not present

## 2022-03-24 DIAGNOSIS — Z5321 Procedure and treatment not carried out due to patient leaving prior to being seen by health care provider: Secondary | ICD-10-CM | POA: Diagnosis not present

## 2022-03-24 DIAGNOSIS — Z3A00 Weeks of gestation of pregnancy not specified: Secondary | ICD-10-CM | POA: Insufficient documentation

## 2022-03-24 DIAGNOSIS — O209 Hemorrhage in early pregnancy, unspecified: Secondary | ICD-10-CM | POA: Diagnosis not present

## 2022-03-24 LAB — CBC
HCT: 36.8 % (ref 36.0–46.0)
Hemoglobin: 12 g/dL (ref 12.0–15.0)
MCH: 29.8 pg (ref 26.0–34.0)
MCHC: 32.6 g/dL (ref 30.0–36.0)
MCV: 91.3 fL (ref 80.0–100.0)
Platelets: 279 10*3/uL (ref 150–400)
RBC: 4.03 MIL/uL (ref 3.87–5.11)
RDW: 13.2 % (ref 11.5–15.5)
WBC: 6.5 10*3/uL (ref 4.0–10.5)
nRBC: 0 % (ref 0.0–0.2)

## 2022-03-24 LAB — HCG, QUANTITATIVE, PREGNANCY: hCG, Beta Chain, Quant, S: 56032 m[IU]/mL — ABNORMAL HIGH (ref <5)

## 2022-03-24 LAB — ABO/RH: ABO/RH(D): O POS

## 2022-03-24 NOTE — ED Provider Triage Note (Signed)
Emergency Medicine Provider Triage Evaluation Note  Teresa Rogers , a 21 y.o. female G1P0  was evaluated in triage.  Pt complains of 3 days of vaginal bleeding. LMP 11/3. Took pregnancy test on 12/13 and found out she was pregnant. No abdominal pain. No clots.   Review of Systems  Positive: Vag bleeding Negative: Abd pain  Physical Exam  BP 105/73 (BP Location: Left Arm)   Pulse 74   Temp 98.6 F (37 C) (Oral)   Resp 17   SpO2 100%  Gen:   Awake, no distress   Resp:  Normal effort  MSK:   Moves extremities without difficulty  Other:    Medical Decision Making  Medically screening exam initiated at 12:19 PM.  Appropriate orders placed.  Maleea Camilo was informed that the remainder of the evaluation will be completed by another provider, this initial triage assessment does not replace that evaluation, and the importance of remaining in the ED until their evaluation is complete.     Jackelyn Hoehn, PA-C 03/24/22 1221

## 2022-03-24 NOTE — ED Triage Notes (Signed)
Pt comes with c/o vaginal bleeding that started 3 days ago. Pt states she is pregnant and unsure of how far along. Pt denies any cramping.

## 2022-03-24 NOTE — ED Notes (Signed)
Called x3 

## 2022-04-03 ENCOUNTER — Encounter: Payer: Self-pay | Admitting: Dermatology

## 2022-04-03 ENCOUNTER — Ambulatory Visit (INDEPENDENT_AMBULATORY_CARE_PROVIDER_SITE_OTHER): Payer: Medicaid Other | Admitting: Dermatology

## 2022-04-03 VITALS — BP 116/57 | HR 93

## 2022-04-03 DIAGNOSIS — Z3201 Encounter for pregnancy test, result positive: Secondary | ICD-10-CM

## 2022-04-03 DIAGNOSIS — B078 Other viral warts: Secondary | ICD-10-CM

## 2022-04-03 DIAGNOSIS — Z7189 Other specified counseling: Secondary | ICD-10-CM | POA: Diagnosis not present

## 2022-04-03 NOTE — Patient Instructions (Addendum)
Cryotherapy Aftercare  Wash gently with soap and water everyday.   Apply Vaseline and Band-Aid daily until healed.    Viral Wart (HPV) Counseling  Discussed viral / HPV (Human Papilloma Virus) etiology and risk of spread /infectivity to other areas of body as well as to other people.  Multiple treatments and methods may be required to clear warts and it is possible treatment may not be successful.  Treatment risks include discoloration; scarring and there is still potential for wart recurrence.    Viral Warts & Molluscum Contagiosum  Viral warts and molluscum contagiosum are growths of the skin caused by viral infection of the skin. If you have been given the diagnosis of viral warts or molluscum contagiosum there are a few things that you must understand about your condition:  There is no guaranteed treatment method available for this condition. Multiple treatments may be required, The treatments may be time consuming and require multiple visits to the dermatology office. The treatment may be expensive. You will be charged each time you come into the office to have the spots treated. The treated areas may develop new lesions further complicating treatment. The treated areas may leave a scar. There is no guarantee that even after multiple treatments that the spots will be successfully treated. These are caused by a viral infection and can be spread to other areas of the skin and to other people by direct contact. Therefore, new spots may occur.   Due to recent changes in healthcare laws, you may see results of your pathology and/or laboratory studies on MyChart before the doctors have had a chance to review them. We understand that in some cases there may be results that are confusing or concerning to you. Please understand that not all results are received at the same time and often the doctors may need to interpret multiple results in order to provide you with the best plan of care or  course of treatment. Therefore, we ask that you please give [place] 2 business days to thoroughly review all your results before contacting the office for clarification. Should we see a critical lab result, you will be contacted sooner.   If You Need Anything After Your Visit  If you have any questions or concerns for your doctor, please call our main line at [phone] and press option 4 to reach your doctor's medical assistant. If no one answers, please leave a voicemail as directed and we will return your call as soon as possible. Messages left after 4 pm will be answered the following business day.   You may also send [place] a message via MyChart. We typically respond to MyChart messages within 1-2 business days.  For prescription refills, please ask your pharmacy to contact our office. Our fax number is [phone].  If you have an urgent issue when the clinic is closed that cannot wait until the next business day, you can page your doctor at the number below.    Please note that while we do our best to be available for urgent issues outside of office hours, we are not available 24/7.   If you have an urgent issue and are unable to reach [place], you may choose to seek medical care at your doctor's office, retail clinic, urgent care center, or emergency room.  If you have a medical emergency, please immediately call 911 or go to the emergency department.  Pager Numbers  - Dr. Gwen Pounds: [phone]  - Dr. Neale Burly: [phone]  - Dr. Roseanne Reno: [phone]  In the event of inclement weather, please call our main line at [phone] for an update on the status of any delays or closures.  Dermatology Medication Tips: Please keep the boxes that topical medications come in in order to help keep track of the instructions about where and how to use these. Pharmacies typically print the medication instructions only on the boxes and not directly on the medication tubes.   If your medication is too  expensive, please contact our office at [phone] option 4 or send [place] a message through [place].   We are unable to tell what your co-pay for medications will be in advance as this is different depending on your insurance coverage. However, we may be able to find a substitute medication at lower cost or fill out paperwork to get insurance to cover a needed medication.   If a prior authorization is required to get your medication covered by your insurance company, please allow [place] 1-2 business days to complete this process.  Drug prices often vary depending on where the prescription is filled and some pharmacies may offer cheaper prices.  The website www.goodrx.com contains coupons for medications through different pharmacies. The prices here do not account for what the cost may be with help from insurance (it may be cheaper with your insurance), but the website can give you the price if you did not use any insurance.  - You can print the associated coupon and take it with your prescription to the pharmacy.  - You may also stop by our office during regular business hours and pick up a GoodRx coupon card.  - If you need your prescription sent electronically to a different pharmacy, notify our office through [facility] or by phone at [phone] option 4.     Si Usted Necesita Algo Despus de Su Visita  Tambin puede enviarnos un mensaje a travs de Pharmacist, community. Por lo general respondemos a los mensajes de MyChart en el transcurso de 1 a 2 das hbiles.  Para renovar recetas, por favor pida a su farmacia que se ponga en contacto con nuestra oficina. Harland Dingwall de fax es [place] [phone].  Si tiene un asunto urgente cuando la clnica est cerrada y que no puede esperar hasta el siguiente da hbil, puede llamar/localizar a su doctor(a) al nmero que aparece a continuacin.   Por favor, tenga en cuenta que aunque hacemos todo lo posible para estar disponibles para asuntos urgentes fuera  del horario de [place], no estamos disponibles las 24 horas del da, los 7 das de la [place].   Si tiene un problema urgente y no puede comunicarse con nosotros, puede optar por buscar atencin mdica  en el consultorio de su doctor(a), en una clnica privada, en un centro de atencin urgente o en una sala de emergencias.  Si tiene Engineering geologist, por favor llame inmediatamente al 911 o vaya a la sala de emergencias.  Nmeros de bper  - Dr. Nehemiah Massed: [phone]  - Dra. Moye: [phone]  - Dra. Nicole Kindred: [phone]  En caso de inclemencias del [place], por favor llame a Johnsie Kindred principal al [phone] para una actualizacin sobre el [place] de cualquier retraso o cierre.  Consejos para la medicacin en dermatologa: Por favor, guarde las cajas en las que vienen los medicamentos de uso tpico para ayudarle a seguir las instrucciones sobre dnde y cmo usarlos. Las farmacias generalmente imprimen las instrucciones del medicamento slo en las cajas y no directamente en los tubos del [place].  Si su medicamento es muy caro, por favor, pngase en contacto con Rolm Gala llamando al [phone] y presione la opcin 4 o envenos un mensaje a travs de Clinical cytogeneticist.   No podemos decirle cul ser su copago por los medicamentos por adelantado ya que esto es diferente dependiendo de la cobertura de su seguro. Sin embargo, es posible que podamos encontrar un medicamento sustituto a Audiological scientist un formulario para que el seguro cubra el medicamento que se considera necesario.   Si se requiere una autorizacin previa para que su compaa de seguros [place] su medicamento, por favor permtanos de 1 a 2 das hbiles para completar [address]  Los precios de los medicamentos varan con frecuencia dependiendo del Environmental consultant de dnde se surte la receta y alguna farmacias pueden ofrecer precios ms baratos.  El sitio web www.goodrx.com tiene cupones para medicamentos de Engineer, civil (consulting). Los precios aqu no tienen en cuenta lo que podra costar con la ayuda del seguro (puede ser ms barato con su seguro), pero el sitio web puede darle el precio si no utiliz Tourist information centre manager.  - Puede imprimir el cupn correspondiente y llevarlo con su receta a la farmacia.  - Tambin puede pasar por nuestra oficina durante el horario de atencin regular y Education officer, museum una tarjeta de cupones de GoodRx.  - Si necesita que su receta se enve electrnicamente a una farmacia diferente, informe a nuestra oficina a travs de MyChart de [place] o por telfono llamando al [phone] y presione la opcin 4.

## 2022-04-03 NOTE — Progress Notes (Signed)
   New Patient Visit  Subjective  Teresa Rogers is a 22 y.o. female who presents for the following: Warts (B/L hands. No previous treatment by doctor. Has tried OTC freeze off). The patient has spots, moles and lesions to be evaluated, some may be new or changing and the patient has concerns that these could be cancer. Mother with patient.   Review of Systems: No other skin or systemic complaints except as noted in HPI or Assessment and Plan.  Objective  Well appearing patient in no apparent distress; mood and affect are within normal limits.  A focused examination was performed including B/L hands. Relevant physical exam findings are noted in the Assessment and Plan.  right index finger periungual x1, L-4 finger periungual x1 (2); middle finger x 1 Verrucous papules -- Discussed viral etiology and contagion.    Assessment & Plan  Other viral warts (3) right index finger periungual x1, L-4 finger periungual x1 Viral Wart (HPV) Counseling  Discussed viral / HPV (Human Papilloma Virus) etiology and risk of spread /infectivity to other areas of body as well as to other people.  Multiple treatments and methods may be required to clear warts and it is possible treatment may not be successful.  Treatment risks include discoloration; scarring and there is still potential for wart recurrence.  Destruction of lesion - right index finger periungual x1, L-4 finger periungual x1 Complexity: simple   Destruction method: cryotherapy   Informed consent: discussed and consent obtained   Timeout:  patient name, date of birth, surgical site, and procedure verified Lesion destroyed using liquid nitrogen: Yes   Region frozen until ice ball extended beyond lesion: Yes   Outcome: patient tolerated procedure well with no complications   Post-procedure details: wound care instructions given    Positive Pregnancy test Discussed positive pregnancy test results. Patient states she is not pregnant. Patient  also has a cyst on her ovary. Advised patient to follow up with OB/GYN to see if positive test is related to cyst on ovary. Advised there are other treatment options to add to regimen for Wart treatment if pt is not pregnant.   Return for Wart Follow UP in 6-8 weeks.  I, Emelia Salisbury, CMA, am acting as scribe for Sarina Ser, MD. Documentation: I have reviewed the above documentation for accuracy and completeness, and I agree with the above.  Sarina Ser, MD

## 2022-04-06 ENCOUNTER — Telehealth: Payer: Self-pay | Admitting: Obstetrics & Gynecology

## 2022-04-06 ENCOUNTER — Ambulatory Visit (INDEPENDENT_AMBULATORY_CARE_PROVIDER_SITE_OTHER): Payer: Medicaid Other

## 2022-04-06 VITALS — Wt 146.0 lb

## 2022-04-06 DIAGNOSIS — Z3480 Encounter for supervision of other normal pregnancy, unspecified trimester: Secondary | ICD-10-CM

## 2022-04-06 DIAGNOSIS — Z3689 Encounter for other specified antenatal screening: Secondary | ICD-10-CM

## 2022-04-06 DIAGNOSIS — Z369 Encounter for antenatal screening, unspecified: Secondary | ICD-10-CM

## 2022-04-06 NOTE — Telephone Encounter (Signed)
I contacted patient via phone. I advised patient to contact centralized scheduling for dating ultrasound. I also advised patient to call back to rescheduled lab appointment for on day or one after her ultrasound has been completed.

## 2022-04-06 NOTE — Progress Notes (Signed)
New OB Intake  I connected with  Teresa Rogers on 04/06/22 at  9:15 AM EST by telephone and verified that I am speaking with the correct person using two identifiers. Nurse is located at [employer] and pt is located at home.  I explained I am completing New OB Intake today. We discussed her EDD of 11/13/2022 that is based on LMP of 02/06/2022. Pt is G1/P0. I reviewed her allergies, medications, Medical/Surgical/OB history, and appropriate screenings. There are cats in the home  no If yes  Based on history, this is a/an pregnancy uncomplicated .   Patient Active Problem List   Diagnosis Date Noted   Supervision of other normal pregnancy, antepartum 04/06/2022   Positive urine pregnancy test 03/08/2022   Menorrhagia with irregular cycle 10/11/2021   Iron deficiency anemia 10/11/2021   Painful menstrual periods 10/11/2021   Other fatigue 10/11/2021   Intractable episodic headache 10/11/2021   Adjustment disorder with mixed anxiety and depressed mood 10/11/2021   Screening for lipoid disorders 10/11/2021   Wart of hand 10/11/2021   Dysmenorrhea 05/28/2018   BMI (body mass index), pediatric, 5% to less than 85% for age 45/24/2020    Concerns addressed today None  Delivery Plans:  Plans to [facility] at [facility]/  Anatomy US Explained first scheduled [place] will be 1/15th and an anatomy scan will be done at 20 weeks.  Labs Discussed genetic screening with patient. Patient desires genetic testing to be drawn with new OB labs. Discussed possible labs to be drawn at new OB appointment.  COVID Vaccine Patient has not had COVID vaccine.   Social Determinants of Health Food Insecurity: denies food insecurity Transportation: Patient denies transportation needs.  First visit review I reviewed new OB appt with pt. I explained she will have ob bloodwork and pap smear/pelvic exam if indicated. Explained pt will be seen by Dr. Clovia Cuff at first visit; encounter routed to  appropriate provider.   Cleophas Dunker, [facility] 04/06/2022  9:42 AM

## 2022-04-06 NOTE — Patient Instructions (Signed)
First Trimester of Pregnancy  The first trimester of pregnancy starts on the first day of your last menstrual period until the end of week 12. This is also called months 1 through 3 of pregnancy. Body changes during your first trimester Your body goes through many changes during pregnancy. The changes usually return to normal after your baby is born. Physical changes You may gain or lose weight. Your breasts may grow larger and hurt. The area around your nipples may get darker. Dark spots or blotches may develop on your face. You may have changes in your hair. Health changes You may feel like you might vomit (nauseous), and you may vomit. You may have heartburn. You may have headaches. You may have trouble pooping (constipation). Your gums may bleed. Other changes You may get tired easily. You may pee (urinate) more often. Your menstrual periods will stop. You may not feel hungry. You may want to eat certain kinds of food. You may have changes in your emotions from day to day. You may have more dreams. Follow these instructions at home: Medicines Take over-the-counter and prescription medicines only as told by your doctor. Some medicines are not safe during pregnancy. Take a prenatal vitamin that contains at least 600 micrograms (mcg) of folic acid. Eating and drinking Eat healthy meals that include: Fresh fruits and vegetables. Whole grains. Good sources of protein, such as meat, eggs, or tofu. Low-fat dairy products. Avoid raw meat and unpasteurized juice, milk, and cheese. If you feel like you may vomit, or you vomit: Eat 4 or 5 small meals a day instead of 3 large meals. Try eating a few soda crackers. Drink liquids between meals instead of during meals. You may need to take these actions to prevent or treat trouble pooping: Drink enough fluids to keep your pee (urine) pale yellow. Eat foods that are high in fiber. These include beans, whole grains, and fresh fruits and  vegetables. Limit foods that are high in fat and sugar. These include fried or sweet foods. Activity Exercise only as told by your doctor. Most people can do their usual exercise routine during pregnancy. Stop exercising if you have cramps or pain in your lower belly (abdomen) or low back. Do not exercise if it is too hot or too humid, or if you are in a place of great height (high altitude). Avoid heavy lifting. If you choose to, you may have sex unless your doctor tells you not to. Relieving pain and discomfort Wear a good support bra if your breasts are sore. Rest with your legs raised (elevated) if you have leg cramps or low back pain. If you have bulging veins (varicose veins) in your legs: Wear support hose as told by your doctor. Raise your feet for 15 minutes, 3-4 times a day. Limit salt in your food. Safety Wear your seat belt at all times when you are in a car. Talk with your doctor if someone is hurting you or yelling at you. Talk with your doctor if you are feeling sad or have thoughts of hurting yourself. Lifestyle Do not use hot tubs, steam rooms, or saunas. Do not douche. Do not use tampons or scented sanitary pads. Do not use herbal medicines, illegal drugs, or medicines that are not approved by your doctor. Do not drink alcohol. Do not smoke or use any products that contain nicotine or tobacco. If you need help quitting, ask your doctor. Avoid cat litter boxes and soil that is used by cats. These carry   germs that can cause harm to the baby and can cause a loss of your baby by miscarriage or stillbirth. General instructions Keep all follow-up visits. This is important. Ask for help if you need counseling or if you need help with nutrition. Your doctor can give you advice or tell you where to go for help. Visit your dentist. At home, brush your teeth with a soft toothbrush. Floss gently. Write down your questions. Take them to your prenatal visits. Where to find more  information American Pregnancy Association: americanpregnancy.org American College of Obstetricians and Gynecologists: www.acog.org Office on Women's Health: womenshealth.gov/pregnancy Contact a doctor if: You are dizzy. You have a fever. You have mild cramps or pressure in your lower belly. You have a nagging pain in your belly area. You continue to feel like you may vomit, you vomit, or you have watery poop (diarrhea) for 24 hours or longer. You have a bad-smelling fluid coming from your vagina. You have pain when you pee. You are exposed to a disease that spreads from person to person, such as chickenpox, measles, Zika virus, HIV, or hepatitis. Get help right away if: You have spotting or bleeding from your vagina. You have very bad belly cramping or pain. You have shortness of breath or chest pain. You have any kind of injury, such as from a fall or a car crash. You have new or increased pain, swelling, or redness in an arm or leg. Summary The first trimester of pregnancy starts on the first day of your last menstrual period until the end of week 12 (months 1 through 3). Eat 4 or 5 small meals a day instead of 3 large meals. Do not smoke or use any products that contain nicotine or tobacco. If you need help quitting, ask your doctor. Keep all follow-up visits. This information is not intended to replace advice given to you by your health care provider. Make sure you discuss any questions you have with your health care provider. Document Revised: 08/20/2019 Document Reviewed: 06/26/2019 Elsevier Patient Education  2023 Elsevier Inc. Commonly Asked Questions During Pregnancy  Cats: A parasite can be excreted in cat feces.  To avoid exposure you need to have another person empty the little box.  If you must empty the litter box you will need to wear gloves.  Wash your hands after handling your cat.  This parasite can also be found in raw or undercooked meat so this should also be  avoided.  Colds, Sore Throats, Flu: Please check your medication sheet to see what you can take for symptoms.  If your symptoms are unrelieved by these medications please call the office.  Dental Work: Most any dental work your dentist recommends is permitted.  X-rays should only be taken during the first trimester if absolutely necessary.  Your abdomen should be shielded with a lead apron during all x-rays.  Please notify your provider prior to receiving any x-rays.  Novocaine is fine; gas is not recommended.  If your dentist requires a note from us prior to dental work please call the office and we will provide one for you.  Exercise: Exercise is an important part of staying healthy during your pregnancy.  You may continue most exercises you were accustomed to prior to pregnancy.  Later in your pregnancy you will most likely notice you have difficulty with activities requiring balance like riding a bicycle.  It is important that you listen to your body and avoid activities that put you at a higher   risk of falling.  Adequate rest and staying well hydrated are a must!  If you have questions about the safety of specific activities ask your provider.    Exposure to Children with illness: Try to avoid obvious exposure; report any symptoms to us when noted,  If you have chicken pos, red measles or mumps, you should be immune to these diseases.   Please do not take any vaccines while pregnant unless you have checked with your OB provider.  Fetal Movement: After 28 weeks we recommend you do "kick counts" twice daily.  Lie or sit down in a calm quiet environment and count your baby movements "kicks".  You should feel your baby at least 10 times per hour.  If you have not felt 10 kicks within the first hour get up, walk around and have something sweet to eat or drink then repeat for an additional hour.  If count remains less than 10 per hour notify your provider.  Fumigating: Follow your pest control agent's  advice as to how long to stay out of your home.  Ventilate the area well before re-entering.  Hemorrhoids:   Most over-the-counter preparations can be used during pregnancy.  Check your medication to see what is safe to use.  It is important to use a stool softener or fiber in your diet and to drink lots of liquids.  If hemorrhoids seem to be getting worse please call the office.   Hot Tubs:  Hot tubs Jacuzzis and saunas are not recommended while pregnant.  These increase your internal body temperature and should be avoided.  Intercourse:  Sexual intercourse is safe during pregnancy as long as you are comfortable, unless otherwise advised by your provider.  Spotting may occur after intercourse; report any bright red bleeding that is heavier than spotting.  Labor:  If you know that you are in labor, please go to the hospital.  If you are unsure, please call the office and let us help you decide what to do.  Lifting, straining, etc:  If your job requires heavy lifting or straining please check with your provider for any limitations.  Generally, you should not lift items heavier than that you can lift simply with your hands and arms (no back muscles)  Painting:  Paint fumes do not harm your pregnancy, but may make you ill and should be avoided if possible.  Latex or water based paints have less odor than oils.  Use adequate ventilation while painting.  Permanents & Hair Color:  Chemicals in hair dyes are not recommended as they cause increase hair dryness which can increase hair loss during pregnancy.  " Highlighting" and permanents are allowed.  Dye may be absorbed differently and permanents may not hold as well during pregnancy.  Sunbathing:  Use a sunscreen, as skin burns easily during pregnancy.  Drink plenty of fluids; avoid over heating.  Tanning Beds:  Because their possible side effects are still unknown, tanning beds are not recommended.  Ultrasound Scans:  Routine ultrasounds are performed  at approximately 20 weeks.  You will be able to see your baby's general anatomy an if you would like to know the gender this can usually be determined as well.  If it is questionable when you conceived you may also receive an ultrasound early in your pregnancy for dating purposes.  Otherwise ultrasound exams are not routinely performed unless there is a medical necessity.  Although you can request a scan we ask that you pay for it when   conducted because insurance does not cover " patient request" scans.  Work: If your pregnancy proceeds without complications you may work until your due date, unless your physician or employer advises otherwise.  Round Ligament Pain/Pelvic Discomfort:  Sharp, shooting pains not associated with bleeding are fairly common, usually occurring in the second trimester of pregnancy.  They tend to be worse when standing up or when you remain standing for long periods of time.  These are the result of pressure of certain pelvic ligaments called "round ligaments".  Rest, Tylenol and heat seem to be the most effective relief.  As the womb and fetus grow, they rise out of the pelvis and the discomfort improves.  Please notify the office if your pain seems different than that described.  It may represent a more serious condition.  Common Medications Safe in Pregnancy  Acne:      Constipation:  Benzoyl Peroxide     Colace  Clindamycin      Dulcolax Suppository  Topica Erythromycin     Fibercon  Salicylic Acid      Metamucil         Miralax AVOID:        Senakot   Accutane    Cough:  Retin-A       Cough Drops  Tetracycline      Phenergan w/ Codeine if Rx  Minocycline      Robitussin (Plain & DM)  Antibiotics:     Crabs/Lice:  Ceclor       RID  Cephalosporins    AVOID:  E-Mycins      Kwell  Keflex  Macrobid/Macrodantin   Diarrhea:  Penicillin      Kao-Pectate  Zithromax      Imodium AD         PUSH FLUIDS AVOID:       Cipro     Fever:  Tetracycline      Tylenol (Regular  or Extra  Minocycline       Strength)  Levaquin      Extra Strength-Do not          Exceed 8 tabs/24 hrs Caffeine:        <200mg/day (equiv. To 1 cup of coffee or  approx. 3 12 oz sodas)         Gas: Cold/Hayfever:       Gas-X  Benadryl      Mylicon  Claritin       Phazyme  **Claritin-D        Chlor-Trimeton    Headaches:  Dimetapp      ASA-Free Excedrin  Drixoral-Non-Drowsy     Cold Compress  Mucinex (Guaifenasin)     Tylenol (Regular or Extra  Sudafed/Sudafed-12 Hour     Strength)  **Sudafed PE Pseudoephedrine   Tylenol Cold & Sinus     Vicks Vapor Rub  Zyrtec  **AVOID if Problems With Blood Pressure         Heartburn: Avoid lying down for at least 1 hour after meals  Aciphex      Maalox     Rash:  Milk of Magnesia     Benadryl    Mylanta       1% Hydrocortisone Cream  Pepcid  Pepcid Complete   Sleep Aids:  Prevacid      Ambien   Prilosec       Benadryl  Rolaids       Chamomile Tea  Tums (Limit 4/day)     Unisom           Tylenol PM         Warm milk-add vanilla or  Hemorrhoids:       Sugar for taste  Anusol/Anusol H.C.  (RX: Analapram 2.5%)  Sugar Substitutes:  Hydrocortisone OTC     Ok in moderation  Preparation H      Tucks        Vaseline lotion applied to tissue with wiping    Herpes:     Throat:  Acyclovir      Oragel  Famvir  Valtrex     Vaccines:         Flu Shot Leg Cramps:       *Gardasil  Benadryl      Hepatitis A         Hepatitis B Nasal Spray:       Pneumovax  Saline Nasal Spray     Polio Booster         Tetanus Nausea:       Tuberculosis test or PPD  Vitamin B6 25 mg TID   AVOID:    Dramamine      *Gardasil  Emetrol       Live Poliovirus  Ginger Root 250 mg QID    MMR (measles, mumps &  High Complex Carbs @ Bedtime    rebella)  Sea Bands-Accupressure    Varicella (Chickenpox)  Unisom 1/2 tab TID     *No known complications           If received before Pain:         Known pregnancy;   Darvocet       Resume series  after  Lortab        Delivery  Percocet    Yeast:   Tramadol      Femstat  Tylenol 3      Gyne-lotrimin  Ultram       Monistat  Vicodin           MISC:         All Sunscreens           Hair Coloring/highlights          Insect Repellant's          (Including DEET)         Mystic Tans  

## 2022-04-06 NOTE — Telephone Encounter (Signed)
-----   Message from Cleophas Dunker, [place] sent at 04/06/2022  9:54 AM EST ----- Ladies?  Shouldn't pt's u/s be at the hosp - has m'caid; also can her labs be done at least a week before she sees Dr. Hulan Fray?  Thanks.

## 2022-04-07 NOTE — Telephone Encounter (Signed)
I contacted patient via phone. Patient's voicemail is full, unable to leave message. I was following up on patient's ultrasound scheduling. Nothing has been scheduled yet.

## 2022-04-07 NOTE — Telephone Encounter (Signed)
Patient was seen 12/29 for imaging. I advise patient we could bring her in on 1/22 with Labs since she would be estimating 10 week for mar NOB labs and MAT21

## 2022-04-10 ENCOUNTER — Other Ambulatory Visit: Payer: Medicaid Other

## 2022-04-13 ENCOUNTER — Emergency Department
Admission: EM | Admit: 2022-04-13 | Discharge: 2022-04-13 | Disposition: A | Discharge status: Home / Self Care | Payer: Medicaid Other | Attending: Emergency Medicine | Admitting: Emergency Medicine

## 2022-04-13 ENCOUNTER — Emergency Department: Payer: Medicaid Other

## 2022-04-13 ENCOUNTER — Encounter: Payer: Self-pay | Admitting: Emergency Medicine

## 2022-04-13 DIAGNOSIS — O209 Hemorrhage in early pregnancy, unspecified: Secondary | ICD-10-CM | POA: Insufficient documentation

## 2022-04-13 DIAGNOSIS — N83201 Unspecified ovarian cyst, right side: Secondary | ICD-10-CM | POA: Diagnosis not present

## 2022-04-13 DIAGNOSIS — O4690 Antepartum hemorrhage, unspecified, unspecified trimester: Secondary | ICD-10-CM

## 2022-04-13 DIAGNOSIS — Z3A08 8 weeks gestation of pregnancy: Secondary | ICD-10-CM | POA: Insufficient documentation

## 2022-04-13 DIAGNOSIS — Z3A01 Less than 8 weeks gestation of pregnancy: Secondary | ICD-10-CM | POA: Diagnosis not present

## 2022-04-13 DIAGNOSIS — O26891 Other specified pregnancy related conditions, first trimester: Secondary | ICD-10-CM | POA: Diagnosis not present

## 2022-04-13 LAB — CBC WITH DIFFERENTIAL/PLATELET
Abs Immature Granulocytes: 0.1 10*3/uL — ABNORMAL HIGH (ref 0.00–0.07)
Basophils Absolute: 0 10*3/uL (ref 0.0–0.1)
Basophils Relative: 0 %
Eosinophils Absolute: 0 10*3/uL (ref 0.0–0.5)
Eosinophils Relative: 0 %
HCT: 35.6 % — ABNORMAL LOW (ref 36.0–46.0)
Hemoglobin: 11.7 g/dL — ABNORMAL LOW (ref 12.0–15.0)
Immature Granulocytes: 1 %
Lymphocytes Relative: 26 %
Lymphs Abs: 2.5 10*3/uL (ref 0.7–4.0)
MCH: 29.7 pg (ref 26.0–34.0)
MCHC: 32.9 g/dL (ref 30.0–36.0)
MCV: 90.4 fL (ref 80.0–100.0)
Monocytes Absolute: 0.8 10*3/uL (ref 0.1–1.0)
Monocytes Relative: 8 %
Neutro Abs: 6.4 10*3/uL (ref 1.7–7.7)
Neutrophils Relative %: 65 %
Platelets: 301 10*3/uL (ref 150–400)
RBC: 3.94 MIL/uL (ref 3.87–5.11)
RDW: 12.9 % (ref 11.5–15.5)
WBC: 9.9 10*3/uL (ref 4.0–10.5)
nRBC: 0 % (ref 0.0–0.2)

## 2022-04-13 LAB — URINALYSIS, ROUTINE W REFLEX MICROSCOPIC
Bacteria, UA: NONE SEEN
Bilirubin Urine: NEGATIVE
Glucose, UA: NEGATIVE mg/dL
Ketones, ur: NEGATIVE mg/dL
Leukocytes,Ua: NEGATIVE
Nitrite: POSITIVE — AB
Protein, ur: NEGATIVE mg/dL
Specific Gravity, Urine: 1.026 (ref 1.005–1.030)
pH: 6 (ref 5.0–8.0)

## 2022-04-13 LAB — COMPREHENSIVE METABOLIC PANEL
ALT: 19 U/L (ref 0–44)
AST: 21 U/L (ref 15–41)
Albumin: 4 g/dL (ref 3.5–5.0)
Alkaline Phosphatase: 50 U/L (ref 38–126)
Anion gap: 9 (ref 5–15)
BUN: 11 mg/dL (ref 6–20)
CO2: 22 mmol/L (ref 22–32)
Calcium: 9 mg/dL (ref 8.9–10.3)
Chloride: 103 mmol/L (ref 98–111)
Creatinine, Ser: 0.58 mg/dL (ref 0.44–1.00)
GFR, Estimated: >60 mL/min (ref >60)
Glucose, Bld: 93 mg/dL (ref 70–99)
Potassium: 3.4 mmol/L — ABNORMAL LOW (ref 3.5–5.1)
Sodium: 134 mmol/L — ABNORMAL LOW (ref 135–145)
Total Bilirubin: 0.3 mg/dL (ref 0.3–1.2)
Total Protein: 7.4 g/dL (ref 6.5–8.1)

## 2022-04-13 LAB — ABO/RH: ABO/RH(D): O POS

## 2022-04-13 LAB — POC URINE PREG, ED: Preg Test, Ur: POSITIVE — AB

## 2022-04-13 LAB — HCG, QUANTITATIVE, PREGNANCY: hCG, Beta Chain, Quant, S: >250000 m[IU]/mL — ABNORMAL HIGH (ref <5)

## 2022-04-13 NOTE — ED Notes (Signed)
Pt Dc to home. Dc instructions reviewed with all questions answered. Pt ambulatory out of dept with steady gait 

## 2022-04-13 NOTE — ED Provider Notes (Signed)
[facility] Provider Note    Event Date/Time   First MD Initiated Contact with Patient 04/13/22 0240     (approximate)   History   Vaginal Bleeding   HPI  Teresa Rogers is a 22 y.o. female who presents to the ED for evaluation of Vaginal Bleeding   G1 presents with a female friend for evaluation of vaginal bleeding.  Reports that she is 8 or 9 weeks by LMP and has not yet established with OB/GYN, she has an appointment coming up on 1/22.  Reports developing intermittent vaginal bleeding, spotting and passage of a small clot today and due to this she presents to the ED.  No abdominal pain, no fever or other novel vaginal discharge.  No other sources of bleeding   Physical Exam   Triage Vital Signs: ED Triage Vitals  Enc Vitals Group     BP 04/13/22 0104 124/78     Pulse Rate 04/13/22 0104 (!) 105     Resp 04/13/22 0104 18     Temp 04/13/22 0104 98.3 F (36.8 C)     Temp Source 04/13/22 0104 Oral     SpO2 04/13/22 0104 99 %     Weight 04/13/22 0058 146 lb 2.6 oz (66.3 kg)     Height 04/13/22 0058 5\' 2"  (1.575 m)     Head Circumference --      Peak Flow --      Pain Score 04/13/22 0058 4     Pain Loc --      Pain Edu? --      Excl. in [place]? --     Most recent vital signs: Vitals:   04/13/22 0104  BP: 124/78  Pulse: (!) 105  Resp: 18  Temp: 98.3 F (36.8 C)  SpO2: 99%    General: Awake, no distress.  CV:  Good peripheral perfusion.  Resp:  Normal effort.  Abd:  No distention.  Soft and benign throughout MSK:  No deformity noted.  Neuro:  No focal deficits appreciated. Other:     ED Results / Procedures / Treatments   Labs (all labs ordered are listed, but only abnormal results are displayed) Labs Reviewed  CBC WITH DIFFERENTIAL/PLATELET - Abnormal; Notable for the following components:      Result Value   Hemoglobin 11.7 (*)    HCT 35.6 (*)    Abs Immature Granulocytes 0.10 (*)    All other components within normal limits   COMPREHENSIVE METABOLIC PANEL - Abnormal; Notable for the following components:   Sodium 134 (*)    Potassium 3.4 (*)    All other components within normal limits  URINALYSIS, ROUTINE W REFLEX MICROSCOPIC - Abnormal; Notable for the following components:   Color, Urine YELLOW (*)    APPearance CLEAR (*)    Hgb urine dipstick SMALL (*)    Nitrite POSITIVE (*)    All other components within normal limits  HCG, QUANTITATIVE, PREGNANCY - Abnormal; Notable for the following components:   hCG, Beta Chain, Quant, S >250,000 (*)    All other components within normal limits  POC URINE PREG, ED - Abnormal; Notable for the following components:   Preg Test, Ur POSITIVE (*)    All other components within normal limits  ABO/RH    EKG   RADIOLOGY Pelvic ultrasound interpreted by me with IUP in place  Official radiology report(s): US OB LESS THAN 14 WEEKS WITH OB TRANSVAGINAL  Result Date: 04/13/2022 CLINICAL DATA:  Pelvic pain  with positive beta HCG EXAM: OBSTETRIC <14 WK [place] AND TRANSVAGINAL OB US TECHNIQUE: Both transabdominal and transvaginal ultrasound examinations were performed for complete evaluation of the gestation as well as the maternal uterus, adnexal regions, and pelvic cul-de-sac. Transvaginal technique was performed to assess early pregnancy. COMPARISON:  03/24/2022 FINDINGS: Intrauterine gestational sac: Present Yolk sac:  Present Embryo:  Present Cardiac Activity: Present Heart Rate: 169 bpm CRL:  22 mm   8 w   6 d                  [place] EDC: 11/17/2022 Subchorionic hemorrhage:  None visualized. Maternal uterus/adnexae: Cystic lesion is noted within the right ovary likely representing a corpus luteum cyst. Adjacent to the normal appearing left ovary there is a simple cystic structure measuring 2.2 x 2.4 cm. This likely represents an exophytic cyst. In retrospect this which is stable from the prior exam. IMPRESSION: Single live intrauterine gestation at 8 weeks 6 days. Simple appearing  cystic structure adjacent to the left ovary. This likely represents an exophytic cyst. Electronically Signed   By: Inez Catalina M.D.   On: 04/13/2022 03:35    PROCEDURES and INTERVENTIONS:  Procedures  Medications - No data to display   IMPRESSION / MDM / [place] / ED COURSE  I reviewed the triage vital signs and the nursing notes.  Differential diagnosis includes, but is not limited to, threatened miscarriage, normal first trimester vaginal bleeding, abruption, subchorionic hematoma  {Patient presents with symptoms of an acute illness or injury that is potentially life-threatening.  22 year old G1 in the first trimester presents with painless vaginal bleeding, without evidence of significant acute pathology and suitable for outpatient management.  No significant anemia or blood loss noted.  No metabolic derangements.  Urine without infectious features or bacteria hCG is quite elevated but her TVUS is reassuring with IUP in place.  No indications for RhoGAM.  She has follow-up in less than a week with OB/GYN and I think this is a reasonable timeframe.  We discussed general first trimester precautions and appropriate return precautions for the ED.      FINAL CLINICAL IMPRESSION(S) / ED DIAGNOSES   Final diagnoses:  Vaginal bleeding in pregnancy     Rx / DC Orders   ED Discharge Orders     None        Note:  This document was prepared using Dragon voice recognition software and may include unintentional dictation errors.   Vladimir Crofts, MD 04/13/22 [id number]

## 2022-04-13 NOTE — ED Triage Notes (Signed)
Pt presents via POV with complaints of vaginal bleeding starting today - Pt states she's 7-[redacted] weeks pregnant. Started having some clotting and cramping yesterday evening. Denies CP or SOB.

## 2022-04-13 NOTE — Discharge Instructions (Addendum)
Use Tylenol for pain and fevers.  Up to 1000 mg per dose, up to 4 times per day.  Do not take more than 4000 mg of Tylenol/acetaminophen within 24 hours..  No NSAID medications at all during pregnancy.  This is medicine like ibuprofen, Advil, aspirin, BC and Goody powders, etc.  Start taking prenatal vitamins ASAP

## 2022-04-13 NOTE — ED Notes (Signed)
Pt to [place] before coming back to ED room

## 2022-04-17 ENCOUNTER — Other Ambulatory Visit (HOSPITAL_COMMUNITY)
Admission: RE | Admit: 2022-04-17 | Discharge: 2022-04-17 | Disposition: A | Discharge status: Home / Self Care | Payer: Medicaid Other | Source: Ambulatory Visit | Attending: Obstetrics & Gynecology | Admitting: Obstetrics & Gynecology

## 2022-04-17 ENCOUNTER — Ambulatory Visit: Payer: Medicaid Other

## 2022-04-17 DIAGNOSIS — Z369 Encounter for antenatal screening, unspecified: Secondary | ICD-10-CM | POA: Diagnosis not present

## 2022-04-17 DIAGNOSIS — Z3480 Encounter for supervision of other normal pregnancy, unspecified trimester: Secondary | ICD-10-CM | POA: Insufficient documentation

## 2022-04-18 LAB — MICROSCOPIC EXAMINATION
Casts: NONE SEEN /lpf
RBC, Urine: NONE SEEN /hpf (ref 0–2)
WBC, UA: NONE SEEN /hpf (ref 0–5)

## 2022-04-18 LAB — URINALYSIS, ROUTINE W REFLEX MICROSCOPIC
Bilirubin, UA: NEGATIVE
Glucose, UA: NEGATIVE
Ketones, UA: NEGATIVE
Leukocytes,UA: NEGATIVE
Nitrite, UA: POSITIVE — AB
Protein,UA: NEGATIVE
RBC, UA: NEGATIVE
Specific Gravity, UA: 1.019 (ref 1.005–1.030)
Urobilinogen, Ur: 0.2 mg/dL (ref 0.2–1.0)
pH, UA: 7 (ref 5.0–7.5)

## 2022-04-18 LAB — CBC/D/PLT+RPR+RH+ABO+RUBIGG...
Antibody Screen: NEGATIVE
Basophils Absolute: 0.1 10*3/uL (ref 0.0–0.2)
Basos: 1 %
EOS (ABSOLUTE): 0.1 10*3/uL (ref 0.0–0.4)
Eos: 1 %
HCV Ab: NONREACTIVE
HIV Screen 4th Generation wRfx: NONREACTIVE
Hematocrit: 35.8 % (ref 34.0–46.6)
Hemoglobin: 12.2 g/dL (ref 11.1–15.9)
Hepatitis B Surface Ag: NEGATIVE
Immature Grans (Abs): 0.1 10*3/uL (ref 0.0–0.1)
Immature Granulocytes: 1 %
Lymphocytes Absolute: 2.3 10*3/uL (ref 0.7–3.1)
Lymphs: 29 %
MCH: 30.9 pg (ref 26.6–33.0)
MCHC: 34.1 g/dL (ref 31.5–35.7)
MCV: 91 fL (ref 79–97)
Monocytes Absolute: 0.6 10*3/uL (ref 0.1–0.9)
Monocytes: 8 %
Neutrophils Absolute: 4.8 10*3/uL (ref 1.4–7.0)
Neutrophils: 60 %
Platelets: 272 10*3/uL (ref 150–450)
RBC: 3.95 x10E6/uL (ref 3.77–5.28)
RDW: 13.1 % (ref 11.7–15.4)
RPR Ser Ql: NONREACTIVE
Rh Factor: POSITIVE
Rubella Antibodies, IGG: 2.62 index (ref >0.99)
Varicella zoster IgG: 577 index (ref >165)
WBC: 7.9 10*3/uL (ref 3.4–10.8)

## 2022-04-18 LAB — URINE CYTOLOGY ANCILLARY ONLY
Chlamydia: NEGATIVE
Comment: NEGATIVE
Comment: NORMAL
Neisseria Gonorrhea: NEGATIVE

## 2022-04-18 LAB — HCV INTERPRETATION

## 2022-04-19 LAB — MONITOR DRUG PROFILE 14(MW)
Amphetamine Scrn, Ur: NEGATIVE ng/mL
BARBITURATE SCREEN URINE: NEGATIVE ng/mL
BENZODIAZEPINE SCREEN, URINE: NEGATIVE ng/mL
Buprenorphine, Urine: NEGATIVE ng/mL
CANNABINOIDS UR QL SCN: NEGATIVE ng/mL
Cocaine (Metab) Scrn, Ur: NEGATIVE ng/mL
Creatinine(Crt), U: 86.2 mg/dL (ref 20.0–300.0)
Fentanyl, Urine: NEGATIVE pg/mL
Meperidine Screen, Urine: NEGATIVE ng/mL
Methadone Screen, Urine: NEGATIVE ng/mL
OXYCODONE+OXYMORPHONE UR QL SCN: NEGATIVE ng/mL
Opiate Scrn, Ur: NEGATIVE ng/mL
Ph of Urine: 6.3 (ref 4.5–8.9)
Phencyclidine Qn, Ur: NEGATIVE ng/mL
Propoxyphene Scrn, Ur: NEGATIVE ng/mL
SPECIFIC GRAVITY: 1.022
Tramadol Screen, Urine: NEGATIVE ng/mL

## 2022-04-19 LAB — NICOTINE SCREEN, URINE: Cotinine Ql Scrn, Ur: POSITIVE ng/mL — AB

## 2022-04-20 LAB — URINE CULTURE, OB REFLEX

## 2022-04-20 LAB — CULTURE, OB URINE

## 2022-04-21 ENCOUNTER — Encounter: Payer: Self-pay | Admitting: Obstetrics & Gynecology

## 2022-04-21 ENCOUNTER — Other Ambulatory Visit: Payer: Self-pay | Admitting: Obstetrics & Gynecology

## 2022-04-21 DIAGNOSIS — O2340 Unspecified infection of urinary tract in pregnancy, unspecified trimester: Secondary | ICD-10-CM | POA: Insufficient documentation

## 2022-04-21 DIAGNOSIS — B951 Streptococcus, group B, as the cause of diseases classified elsewhere: Secondary | ICD-10-CM

## 2022-04-21 MED ORDER — AMPICILLIN 500 MG PO CAPS: 500.0000 mg | ORAL_CAPSULE | Freq: Four times a day (QID) | ORAL | 0 refills | Status: DC

## 2022-04-21 NOTE — Progress Notes (Signed)
GBS UTI treated with amp Patient notified

## 2022-04-25 ENCOUNTER — Ambulatory Visit: Payer: Medicaid Other | Admitting: Family

## 2022-04-28 ENCOUNTER — Encounter: Payer: Self-pay | Admitting: Family

## 2022-04-28 ENCOUNTER — Ambulatory Visit (INDEPENDENT_AMBULATORY_CARE_PROVIDER_SITE_OTHER): Payer: Medicaid Other | Admitting: Family

## 2022-04-28 VITALS — BP 116/78 | HR 100 | Temp 98.4°F | Ht 61.0 in | Wt 152.4 lb

## 2022-04-28 DIAGNOSIS — F4323 Adjustment disorder with mixed anxiety and depressed mood: Secondary | ICD-10-CM

## 2022-04-28 DIAGNOSIS — D500 Iron deficiency anemia secondary to blood loss (chronic): Secondary | ICD-10-CM | POA: Diagnosis not present

## 2022-04-28 DIAGNOSIS — E663 Overweight: Secondary | ICD-10-CM | POA: Diagnosis not present

## 2022-04-28 DIAGNOSIS — O039 Complete or unspecified spontaneous abortion without complication: Secondary | ICD-10-CM | POA: Diagnosis not present

## 2022-04-28 DIAGNOSIS — Z6828 Body mass index (BMI) 28.0-28.9, adult: Secondary | ICD-10-CM

## 2022-04-28 DIAGNOSIS — R350 Frequency of micturition: Secondary | ICD-10-CM

## 2022-04-28 DIAGNOSIS — N939 Abnormal uterine and vaginal bleeding, unspecified: Secondary | ICD-10-CM

## 2022-04-28 DIAGNOSIS — F319 Bipolar disorder, unspecified: Secondary | ICD-10-CM | POA: Diagnosis not present

## 2022-04-28 LAB — POC URINALSYSI DIPSTICK (AUTOMATED)
Bilirubin, UA: NEGATIVE
Glucose, UA: NEGATIVE
Ketones, UA: NEGATIVE
Nitrite, UA: POSITIVE
Protein, UA: POSITIVE — AB
Spec Grav, UA: 1.015 (ref 1.010–1.025)
Urobilinogen, UA: 0.2 E.U./dL
pH, UA: 6.5 (ref 5.0–8.0)

## 2022-04-28 LAB — IBC + FERRITIN
Ferritin: 7.3 ng/mL — ABNORMAL LOW (ref 10.0–291.0)
Iron: 36 ug/dL — ABNORMAL LOW (ref 42–145)
Saturation Ratios: 8.1 % — ABNORMAL LOW (ref 20.0–50.0)
TIBC: 443.8 ug/dL (ref 250.0–450.0)
Transferrin: 317 mg/dL (ref 212.0–360.0)

## 2022-04-28 LAB — HCG, QUANTITATIVE, PREGNANCY: Quantitative HCG: 5473 m[IU]/mL

## 2022-04-28 LAB — CBC
HCT: 33.8 % — ABNORMAL LOW (ref 36.0–46.0)
Hemoglobin: 11.7 g/dL — ABNORMAL LOW (ref 12.0–15.0)
MCHC: 34.5 g/dL (ref 30.0–36.0)
MCV: 90.3 fl (ref 78.0–100.0)
Platelets: 308 10*3/uL (ref 150.0–400.0)
RBC: 3.75 Mil/uL — ABNORMAL LOW (ref 3.87–5.11)
RDW: 13.7 % (ref 11.5–14.6)
WBC: 7.7 10*3/uL (ref 4.5–10.5)

## 2022-04-28 MED ORDER — SERTRALINE HCL 50 MG PO TABS: 50.0000 mg | ORAL_TABLET | Freq: Every day | ORAL | 3 refills | Status: DC

## 2022-04-28 NOTE — Assessment & Plan Note (Signed)
Repeat cbc ibc ferritin

## 2022-04-28 NOTE — Assessment & Plan Note (Signed)
Pt states thinks she remembers being diagnosed at bipolar in the past  Referral to psychiatry and psychology at this time.    I instructed pt to start zoloft 1/2 tablet once daily for 1 week and then increase to a full tablet once daily on week two as tolerated.  We discussed common side effects such as nausea, drowsiness and weight gain.  Also discussed rare but serious side effect of suicidal ideation.  She is instructed to discontinue medication and go directly to ED if this occurs.  Pt verbalizes understanding.  Plan is to follow up in 30 days to evaluate progress.  I did advise pt sometimes this can exacerbate mania/depression so to let me know immediately.

## 2022-04-28 NOTE — Assessment & Plan Note (Signed)
Ordering hcg, pending results.

## 2022-04-28 NOTE — Patient Instructions (Addendum)
------------------------------------    Stop by the lab prior to leaving today. I will notify you of your results once received.    ------------------------------------  A referral was placed today for therapy and also psychiatry.   Please let us know if you have not heard back within 2 weeks about the referral.  A referral was placed today. Please let us know if you have not heard back within 2 weeks about the referral.   ------------------------------------  Start zoloft , 50 mg for anxiety and depression. Take 1/2 tablet by mouth once daily for about one week, then increase to 1 full tablet thereafter.   Taking the medicine as directed and not missing any doses is one of the best things you can do to treat your anxiety/depression.  Here are some things to keep in mind:  Side effects (stomach upset, some increased anxiety) may happen before you notice a benefit.  These side effects typically go away over time. Changes to your dose of medicine or a change in medication all together is sometimes necessary Many people will notice an improvement within two weeks but the full effect of the medication can take up to 4-6 weeks Stopping the medication when you start feeling better often results in a return of symptoms. Most people need to be on medication at least 6-12 months If you start having thoughts of hurting yourself or others after starting this medicine, please call me immediately.     ------------------------------------   I think this is a great option and appreciate you reaching out. From my experience in the past as far as referrals I recommend you call and reach out to the following two places to see if they have any Medicaid openings for therapy.   If they do please let me know and I will place the referral to the appropriate place.  If neither of these are an option I do recommend that you call your insurance and see available options and we can also place that referral  appropriately with whatever you may find.  [facility] Phone # [phone] They have locations in [place], [place], [place], and [place]. They do take [employer] I know for sure and the [employer].   [facility] [address] [phone]  [place], [place], [place] [address]    [employer] [address] [place], [place] [address]   [phone]   If at any time you feel your needs are more urgent or you are concerned for your well being please see the below options:  For walk in options for mental health:   [facility] [address] in [place], [place]. Call our 24-hour HelpLine at [phone] or [phone] for immediate assistance for mental health and substance abuse issues.  And or walk into [facility] hospital ER.   [employer] Network: 1-800-SUICIDE  The [employer] Suicide Prevention Lifeline: 1-800-273-TALK  Regards,   Eugenia Pancoast FNP-C

## 2022-04-28 NOTE — Assessment & Plan Note (Signed)
Order hcg today to verify trending down.  Advised pt to keep f/u with gyn 2/5. Evaluate need for possible d/c.  Discussed birth control options, pt would like to have a few days to decide if iud or nuva ring are a better option for her as she does not desire pregnancy at this time.  Discussed with pt red flags and when to go to ER

## 2022-04-28 NOTE — Progress Notes (Unsigned)
Established Patient Office Visit  Subjective:      CC:  Chief Complaint  Patient presents with   Medical Management of Chronic Issues   Contraception    HPI: Teresa Rogers is a 22 y.o. female presenting on 04/28/2022 for Medical Management of Chronic Issues and Contraception . Was recently found to be pregnant 12/13, she states she had a miscarriage. She has been cramping on and off but with some improvement, and has been bleeding slightly but bleeding is improving. Now feels more like a normal period cramp.   Has appt 2/5 with obgyn.   Here to discuss getting on OCPs. In 2020 was on depo injection, but made her bleed for an entire year. She states wouldn't be able to do a pill daily.   New complaints:  Anxiety/depression: prescribed in 2020 antidepressants, prozac from what she remembers, she didn't like it because it made her feel like she was lacking emotions and felt like she was 'just there'. Today, denies SI HI.   She was given diagnosis of bipolar depression, and was staying in bed for days at a time. At other times was staying up for days as well and everything felt 'fast' and rapid pressured moving. Did have episodes of excessive spending, with her recent miscarriage, she went shopping and spent more than she was supposed to have been spending with her budget.      04/28/2022    8:09 AM 03/08/2022   10:44 AM 10/11/2021   11:21 AM  PHQ9 SCORE ONLY  PHQ-9 Total Score 15 21 16       04/28/2022    8:10 AM 10/11/2021   11:21 AM  GAD 7 : Generalized Anxiety Score  Nervous, Anxious, on Edge 3 3  Control/stop worrying 1 2  Worry too much - different things 1 2  Trouble relaxing 0 1  Restless 2 2  Easily annoyed or irritable 3 3  Afraid - awful might happen 2 3  Total GAD 7 Score 12 16  Anxiety Difficulty Somewhat difficult Very difficult      Social history:  Relevant past medical, surgical, family and social history reviewed and updated as indicated. Interim  medical history since our last visit reviewed.  Allergies and medications reviewed and updated.  DATA REVIEWED: CHART IN EPIC     ROS: Negative unless specifically indicated above in HPI.    Current Outpatient Medications:    hydrOXYzine (ATARAX) 10 MG tablet, Take 1 tablet (10 mg total) by mouth 3 (three) times daily as needed for anxiety or nausea., Disp: 30 tablet, Rfl: 0   sertraline (ZOLOFT) 50 MG tablet, Take 1 tablet (50 mg total) by mouth daily., Disp: 30 tablet, Rfl: 3      Objective:    BP 116/78   Pulse 100   Temp 98.4 F (36.9 C) (Temporal)   Ht 5\' 1"  (1.549 m)   Wt 152 lb 6.4 oz (69.1 kg)   LMP 02/06/2022 (Exact Date)   SpO2 99%   Breastfeeding Unknown   BMI 28.80 kg/m   Wt Readings from Last 3 Encounters:  04/28/22 152 lb 6.4 oz (69.1 kg)  04/13/22 146 lb (66.2 kg)  04/06/22 146 lb (66.2 kg)    Physical Exam Constitutional:      General: She is not in acute distress.    Appearance: Normal appearance. She is normal weight. She is not ill-appearing, toxic-appearing or diaphoretic.  HENT:     Head: Normocephalic.  Cardiovascular:     Rate  and Rhythm: Normal rate.  Pulmonary:     Effort: Pulmonary effort is normal.  Abdominal:     General: Abdomen is flat. There is no distension.     Palpations: There is no mass.     Tenderness: There is abdominal tenderness (mild suprapubic bil tenderness). There is no guarding or rebound.  Musculoskeletal:        General: Normal range of motion.  Neurological:     General: No focal deficit present.     Mental Status: She is alert and oriented to person, place, and time. Mental status is at baseline.  Psychiatric:        Mood and Affect: Mood normal.        Behavior: Behavior normal.        Thought Content: Thought content normal.        Judgment: Judgment normal.            Assessment & Plan:  Adjustment disorder with mixed anxiety and depressed mood Assessment & Plan: Pt states thinks she remembers  being diagnosed at bipolar in the past  Referral to psychiatry and psychology at this time.    I instructed pt to start zoloft 1/2 tablet once daily for 1 week and then increase to a full tablet once daily on week two as tolerated.  We discussed common side effects such as nausea, drowsiness and weight gain.  Also discussed rare but serious side effect of suicidal ideation.  She is instructed to discontinue medication and go directly to ED if this occurs.  Pt verbalizes understanding.  Plan is to follow up in 30 days to evaluate progress.  I did advise pt sometimes this can exacerbate mania/depression so to let me know immediately.    Orders: -     Ambulatory referral to Psychology -     Ambulatory referral to Psychiatry -     Sertraline HCl; Take 1 tablet (50 mg total) by mouth daily.  Dispense: 30 tablet; Refill: 3  Bipolar disorder with depression ([place]) -     Ambulatory referral to Psychiatry  Iron deficiency anemia due to chronic blood loss Assessment & Plan: Repeat cbc ibc ferritin   Orders: -     CBC -     IBC + Ferritin  Miscarriage at 8 to [redacted] weeks gestation Assessment & Plan: Order hcg today to verify trending down.  Advised pt to keep f/u with gyn 2/5. Evaluate need for possible d/c.  Discussed birth control options, pt would like to have a few days to decide if iud or nuva ring are a better option for her as she does not desire pregnancy at this time.  Discussed with pt red flags and when to go to ER  Orders: -     hCG, quantitative, pregnancy -     POCT Urinalysis Dipstick (Automated) -     Urine Culture  Urinary frequency -     POCT Urinalysis Dipstick (Automated) -     Urine Culture  Abnormal uterine bleeding Assessment & Plan: Ordering hcg, pending results.   Overweight with body mass index (BMI) of 28 to 28.9 in adult     Return in about 1 month (around 05/27/2022) for f/u anxiety depression .  Eugenia Pancoast, MSN, APRN, FNP-C [place]

## 2022-05-01 ENCOUNTER — Other Ambulatory Visit: Payer: Medicaid Other

## 2022-05-01 ENCOUNTER — Encounter: Payer: Medicaid Other | Admitting: Obstetrics & Gynecology

## 2022-05-10 ENCOUNTER — Encounter: Payer: Medicaid Other | Admitting: Licensed Practical Nurse

## 2022-05-11 ENCOUNTER — Telehealth: Payer: Self-pay | Admitting: Licensed Practical Nurse

## 2022-05-11 NOTE — Telephone Encounter (Signed)
I contacted patient via phone. I left message for patient to call back to be scheduled for birth control follow  up

## 2022-05-11 NOTE — Telephone Encounter (Signed)
-----   Message from Allen Derry, CNM sent at 05/10/2022  3:27 PM EST ----- Regarding: RE: Appointment Cancelled Please have her come into the office for a visit.  Thanks, [facility]  ----- Message ----- From: Audree Camel Sent: 05/10/2022   3:01 PM EST To: Inis Sizer, CMA; Allen Derry, CNM Subject: Appointment Cancelled                          This patient was scheduled for today for NOB care. Patient called to cancel this appointment today due to having a miscarriage. But the patient is requesting to follow up for birthcontrol now. Please advise Follow up care?

## 2022-05-17 ENCOUNTER — Ambulatory Visit: Payer: Medicaid Other | Admitting: Dermatology

## 2022-07-03 ENCOUNTER — Ambulatory Visit (INDEPENDENT_AMBULATORY_CARE_PROVIDER_SITE_OTHER): Payer: Medicaid Other | Admitting: Dermatology

## 2022-07-03 ENCOUNTER — Encounter: Payer: Self-pay | Admitting: Dermatology

## 2022-07-03 DIAGNOSIS — Z7189 Other specified counseling: Secondary | ICD-10-CM

## 2022-07-03 DIAGNOSIS — B079 Viral wart, unspecified: Secondary | ICD-10-CM | POA: Diagnosis not present

## 2022-07-03 DIAGNOSIS — Z79899 Other long term (current) drug therapy: Secondary | ICD-10-CM

## 2022-07-03 NOTE — Patient Instructions (Addendum)
Cryotherapy Aftercare  Wash gently with soap and water everyday.   Apply Vaseline and Band-Aid daily until healed.    Cantharidin Plus is a blistering agent that comes from a beetle.  It needs to be washed off in about 4 hours after application.  Although it is painless when applied in office, it may cause symptoms of mild pain and burning several hours later.  Treated areas will swell and turn red, and blisters may form.  Vaseline and a bandaid may be applied until wound has healed.  Once healed, the skin may remain temporarily discolored.  It can take weeks to months for pigmentation to return to normal.  Advised to wash off with soap and water in 4 hours or sooner if it becomes tender before then.   Due to recent changes in healthcare laws, you may see results of your pathology and/or laboratory studies on MyChart before the doctors have had a chance to review them. We understand that in some cases there may be results that are confusing or concerning to you. Please understand that not all results are received at the same time and often the doctors may need to interpret multiple results in order to provide you with the best plan of care or course of treatment. Therefore, we ask that you please give us 2 business days to thoroughly review all your results before contacting the office for clarification. Should we see a critical lab result, you will be contacted sooner.   If You Need Anything After Your Visit  If you have any questions or concerns for your doctor, please call our main line at [phone] and press option 4 to reach your doctor's medical assistant. If no one answers, please leave a voicemail as directed and we will return your call as soon as possible. Messages left after 4 pm will be answered the following business day.   You may also send us a message via MyChart. We typically respond to MyChart messages within 1-2 business days.  For prescription refills, please ask your  pharmacy to contact our office. Our fax number is [phone].  If you have an urgent issue when the clinic is closed that cannot wait until the next business day, you can page your doctor at the number below.    Please note that while we do our best to be available for urgent issues outside of office hours, we are not available 24/7.   If you have an urgent issue and are unable to reach us, you may choose to seek medical care at your doctor's office, retail clinic, urgent care center, or emergency room.  If you have a medical emergency, please immediately call 911 or go to the emergency department.  Pager Numbers  - Dr. Kowalski: [phone]  - Dr. Moye: [phone]  - Dr. Stewart: [phone]  In the event of inclement weather, please call our main line at [phone] for an update on the status of any delays or closures.  Dermatology Medication Tips: Please keep the boxes that topical medications come in in order to help keep track of the instructions about where and how to use these. Pharmacies typically print the medication instructions only on the boxes and not directly on the medication tubes.   If your medication is too expensive, please contact our office at [phone] option 4 or send us a message through MyChart.   We are unable to tell what your co-pay for medications will be in advance as this is different depending on   your insurance coverage. However, we may be able to find a substitute medication at lower cost or fill out paperwork to get insurance to cover a needed medication.   If a prior authorization is required to get your medication covered by your insurance company, please allow us 1-2 business days to complete this process.  Drug prices often vary depending on where the prescription is filled and some pharmacies may offer cheaper prices.  The website www.goodrx.com contains coupons for medications through different pharmacies. The prices here do not  account for what the cost may be with help from insurance (it may be cheaper with your insurance), but the website can give you the price if you did not use any insurance.  - You can print the associated coupon and take it with your prescription to the pharmacy.  - You may also stop by our office during regular business hours and pick up a GoodRx coupon card.  - If you need your prescription sent electronically to a different pharmacy, notify our office through [facility] MyChart or by phone at [phone] option 4.     Si Usted Necesita Algo Despus de Su Visita  Tambin puede enviarnos un mensaje a travs de MyChart. Por lo general respondemos a los mensajes de MyChart en el transcurso de 1 a 2 das hbiles.  Para renovar recetas, por favor pida a su farmacia que se ponga en contacto con nuestra oficina. Nuestro nmero de fax es el [phone].  Si tiene un asunto urgente cuando la clnica est cerrada y que no puede esperar hasta el siguiente da hbil, puede llamar/localizar a su doctor(a) al nmero que aparece a continuacin.   Por favor, tenga en cuenta que aunque hacemos todo lo posible para estar disponibles para asuntos urgentes fuera del horario de oficina, no estamos disponibles las 24 horas del da, los 7 das de la semana.   Si tiene un problema urgente y no puede comunicarse con nosotros, puede optar por buscar atencin mdica  en el consultorio de su doctor(a), en una clnica privada, en un centro de atencin urgente o en una sala de emergencias.  Si tiene una emergencia mdica, por favor llame inmediatamente al 911 o vaya a la sala de emergencias.  Nmeros de bper  - Dr. Kowalski: [phone]  - Dra. Moye: [phone]  - Dra. Stewart: [phone]  En caso de inclemencias del tiempo, por favor llame a nuestra lnea principal al [phone] para una actualizacin sobre el estado de cualquier retraso o cierre.  Consejos para la medicacin en dermatologa: Por  favor, guarde las cajas en las que vienen los medicamentos de uso tpico para ayudarle a seguir las instrucciones sobre dnde y cmo usarlos. Las farmacias generalmente imprimen las instrucciones del medicamento slo en las cajas y no directamente en los tubos del medicamento.   Si su medicamento es muy caro, por favor, pngase en contacto con nuestra oficina llamando al [phone] y presione la opcin 4 o envenos un mensaje a travs de MyChart.   No podemos decirle cul ser su copago por los medicamentos por adelantado ya que esto es diferente dependiendo de la cobertura de su seguro. Sin embargo, es posible que podamos encontrar un medicamento sustituto a menor costo o llenar un formulario para que el seguro cubra el medicamento que se considera necesario.   Si se requiere una autorizacin previa para que su compaa de seguros cubra su medicamento, por favor permtanos de 1 a 2 das hbiles para completar este proceso.    Los precios de los medicamentos varan con frecuencia dependiendo del lugar de dnde se surte la receta y alguna farmacias pueden ofrecer precios ms baratos.  El sitio web www.goodrx.com tiene cupones para medicamentos de diferentes farmacias. Los precios aqu no tienen en cuenta lo que podra costar con la ayuda del seguro (puede ser ms barato con su seguro), pero el sitio web puede darle el precio si no utiliz ningn seguro.  - Puede imprimir el cupn correspondiente y llevarlo con su receta a la farmacia.  - Tambin puede pasar por nuestra oficina durante el horario de atencin regular y recoger una tarjeta de cupones de GoodRx.  - Si necesita que su receta se enve electrnicamente a una farmacia diferente, informe a nuestra oficina a travs de MyChart de [facility] o por telfono llamando al [phone] y presione la opcin 4.  

## 2022-07-03 NOTE — Progress Notes (Signed)
   Follow-Up Visit   Subjective  Teresa Rogers is a 22 y.o. female who presents for the following: Wart At right index finger periungual, left 4th finger periungual and recurrent at right 3rd finger treated with LN2 at last visit.   The following portions of the chart were reviewed this encounter and updated as appropriate: medications, allergies, medical history  Review of Systems:  No other skin or systemic complaints except as noted in HPI or Assessment and Plan.  Objective  Well appearing patient in no apparent distress; mood and affect are within normal limits.  Areas Examined: Hands and fingers  Relevant physical exam findings are noted in the Assessment and Plan.   Assessment & Plan    WART Exam: verrucous papule(s) right index finger periungual 1.5 x 0.7 cm, left 4th finger periungual 0.3 cm, right middle finger 0.3 cm  Counseling Discussed viral / HPV (Human Papilloma Virus) etiology and risk of spread /infectivity to other areas of body as well as to other people.  Multiple treatments and methods may be required to clear warts and it is possible treatment may not be successful.  Treatment risks include discoloration; scarring and there is still potential for wart recurrence.  Treatment Plan: Destruction Procedure Note Destruction method: cryotherapy   Informed consent: discussed and consent obtained   Lesion destroyed using liquid nitrogen: Yes   Outcome: patient tolerated procedure well with no complications   Post-procedure details: wound care instructions given   Locations: right index finger periungual, left 4th finger periungual, right 3rd finger # of Lesions Treated: 3  Destruction Procedure Note Destruction method: chemical removal Informed consent: discussed and consent obtained   Chemical destruction method: Cantharone Plus Outcome: patient tolerated procedure well with no complications   Post-procedure details: Advised to wash off with soap and water in  4 hours or sooner if it becomes tender before then. Locations: right index finger periungual, left 4th finger periungual, right 3rd finger # of Lesions Treated: 3  Prior to procedure discussed that Cantharidin Plus is a blistering agent that comes from a beetle.  It needs to be washed off in about 4 hours after application.  Although it is painless when applied in office, it may cause symptoms of mild pain and burning several hours later.  Treated areas will swell and turn red, and blisters may form.  Vaseline and a bandaid may be applied until wound has healed.  Once healed, the skin may remain temporarily discolored.  It can take weeks to months for pigmentation to return to normal.    Squaric Acid 3% applied to warts today. Prior to application reviewed risk of inflammation and irritation.   Prior to procedure, discussed risks of blister formation, small wound, skin dyspigmentation, or rare scar following cryotherapy. Recommend Vaseline ointment to treated areas while healing.  Squaric Acid 3% applied to left upper inner arm and covered with band aid. Patient has topical steroid available to treat topically if rash occurs.   Patient was pregnant at last visit but miscarried and is not pregnant now so we will be more aggressive with her treatment today.[name], RMA, am acting as scribe for Armida Sans, MD .  Documentation: I have reviewed the above documentation for accuracy and completeness, and I agree with the above.  Armida Sans, MD

## 2022-07-12 ENCOUNTER — Encounter: Payer: Self-pay | Admitting: Dermatology

## 2022-07-26 ENCOUNTER — Ambulatory Visit (INDEPENDENT_AMBULATORY_CARE_PROVIDER_SITE_OTHER): Payer: Medicaid Other | Admitting: Psychiatry

## 2022-07-26 ENCOUNTER — Encounter: Payer: Self-pay | Admitting: Psychiatry

## 2022-07-26 VITALS — BP 107/73 | HR 93 | Temp 98.4°F | Ht 61.0 in | Wt 151.0 lb

## 2022-07-26 DIAGNOSIS — F439 Reaction to severe stress, unspecified: Secondary | ICD-10-CM | POA: Diagnosis not present

## 2022-07-26 DIAGNOSIS — F330 Major depressive disorder, recurrent, mild: Secondary | ICD-10-CM

## 2022-07-26 DIAGNOSIS — F411 Generalized anxiety disorder: Secondary | ICD-10-CM | POA: Diagnosis not present

## 2022-07-26 MED ORDER — HYDROXYZINE HCL 10 MG PO TABS: 10.0000 mg | ORAL_TABLET | Freq: Two times a day (BID) | ORAL | 1 refills | Status: DC | PRN
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
## 2022-07-26 NOTE — Patient Instructions (Signed)
  www.[email or web address].org  www.psychologytoday  [facility] [email or web address] [address], [place], [place] [address]   [phone]  [facility] [email or web address] [address], [place], [place] [address]   [phone]   [facility] - [phone]  [facility] - [phone]  [facility] - [phone]   [facility] - [phone]  [facility] - [phone]   [email or web address] this clinician can offer telehealth and has a sliding scale option  [email or web address].thekgrgroup.org/ this group also offers sliding scale rates and is based out of [place]  Dr. Keisha Rogers with the [facility] specializes in divorce  [facility] has interns who offer sliding scale rates and some of the full time clinicians do, as well. You complete their contact form on their website and the referrals coordinator will help to get connected to someone   

## 2022-08-02 ENCOUNTER — Ambulatory Visit: Payer: Medicaid Other | Admitting: Dermatology

## 2023-02-20 ENCOUNTER — Ambulatory Visit: Payer: No Typology Code available for payment source | Admitting: Internal Medicine

## 2023-08-06 ENCOUNTER — Ambulatory Visit (INDEPENDENT_AMBULATORY_CARE_PROVIDER_SITE_OTHER): Admitting: Family

## 2023-08-06 VITALS — BP 104/64 | HR 91 | Temp 98.4°F | Ht 61.0 in | Wt 156.0 lb

## 2023-08-06 DIAGNOSIS — Z3009 Encounter for other general counseling and advice on contraception: Secondary | ICD-10-CM | POA: Insufficient documentation

## 2023-08-06 DIAGNOSIS — F439 Reaction to severe stress, unspecified: Secondary | ICD-10-CM | POA: Diagnosis not present

## 2023-08-06 DIAGNOSIS — F4323 Adjustment disorder with mixed anxiety and depressed mood: Secondary | ICD-10-CM | POA: Diagnosis not present

## 2023-08-06 DIAGNOSIS — F411 Generalized anxiety disorder: Secondary | ICD-10-CM

## 2023-08-06 MED ORDER — SERTRALINE HCL 50 MG PO TABS: 50.0000 mg | ORAL_TABLET | Freq: Every day | ORAL | 1 refills | Status: DC

## 2023-08-06 NOTE — Patient Instructions (Addendum)
  A referral was placed today for gynecology  Please let us  know if you have not heard back within 2 weeks about the referral.  ------------------------------------  Start 50 mg for anxiety and depression. Take 1/2 tablet by mouth once daily for about one week, then increase to 1 full tablet thereafter.   Taking the medicine as directed and not missing any doses is one of the best things you can do to treat your anxiety/depression.  Here are some things to keep in mind:  Side effects (stomach upset, some increased anxiety) may happen before you notice a benefit.  These side effects typically go away over time. Changes to your dose of medicine or a change in medication all together is sometimes necessary Many people will notice an improvement within two weeks but the full effect of the medication can take up to 4-6 weeks Stopping the medication when you start feeling better often results in a return of symptoms. Most people need to be on medication at least 6-12 months If you start having thoughts of hurting yourself or others after starting this medicine, please call me immediately.    ------------------------------------      A referral was placed today for therapy.  Please let us  know if you have not heard back within 2 weeks about the referral.  ------------------------------------  www.[email or web address]   www.psychologytoday   [employer] [facility] [employer] [email or web address] [address], [place], [place] [address]  [phone]   [facility] [email or web address] [address], [place], [place] [address]   [phone]     [facility] - [phone]   [facility] - [phone]   [facility] - [phone] counseling [phone]   [facility] [phone]    [email or web address] this clinician can offer  telehealth and has a sliding scale option  [email or web address]-gentry.[email or web address]/ this group also offers sliding scale rates and is based out of [place]  Dr. Gara July with the [facility] specializes in divorce  [facility] has interns who offer sliding scale rates and some of the full time clinicians do, as well. You complete their contact form on their website and the referrals coordinator will help to get connected to someone         ------------------------------------  Dialectical behavioral therapy workbook, find this book on amazon.   The Dialectical Behavior Therapy Skills Workbook: Practical DBT Exercises for Learning Mindfulness, Interpersonal Effectiveness, Emotion Regulation, and Distress Tolerance  ------------------------------------  If at any time you feel your needs are more urgent or you are concerned for your well being please see the below options:  For walk in options for mental health:   [facility] [address] in [place], [place]. Call our 24-hour HelpLine at [phone] or [phone] for immediate assistance for mental health and substance abuse issues.  And or walk into [facility] hospital ER.   [employer]: 1-800-SUICIDE  The [employer] Suicide Prevention Lifeline: 1-800-273-TALK  Regards,   Felicita Horns FNP-C

## 2023-08-06 NOTE — Assessment & Plan Note (Signed)
 Pt agreeable to start medications Recommending therapy with CBT referral placed  Pt giving options for safety plan.   I instructed pt to start sertraline  1/2 tablet once daily for 1 week and then increase to a full tablet once daily on week two as tolerated.  We discussed common side effects such as nausea, drowsiness and weight gain.  Also discussed rare but serious side effect of suicidal ideation.  She is instructed to discontinue medication and go directly to ED if this occurs.  Pt verbalizes understanding.  Plan is to follow up in 30 days to evaluate progress.

## 2023-08-06 NOTE — Assessment & Plan Note (Signed)
 Discussed options Pt would like IUD we do not place these here however will refer to gynecology  Discussed condom usage with sex and how to prevent pregnancy

## 2023-08-06 NOTE — Progress Notes (Signed)
 Established Patient Office Visit  Subjective:   Patient ID: Teresa Rogers, female    DOB: 2000-04-13  Age: 23 y.o. MRN: [id number]  CC:  Chief Complaint  Patient presents with   Depression    HPI: Teresa Rogers is a 23 y.o. female presenting on 08/06/2023 for Depression  Depression and anxiety with PTSD history: has been sleeping a lot more than often, not eating well, barely once a day, no appetite. Does have SI however no active plan and states she 'wouldn't actually do it'. Crying a lot more than often. She was diagnosed with bipolar depression in 2020 but recently re evaluated by psychiatrist and this was questionable. She also does worry a lot and thinks about things that 'can happen' that have not happened yet. She has tried prozac  in the past with Suicidal thoughts worsening so she stopped. She had also restarted 2024 sertraline  but she ran out and never got a refill, she does think this was helping at the time.       08/06/2023    8:13 AM 07/26/2022   11:29 AM 04/28/2022    8:09 AM  PHQ9 SCORE ONLY  PHQ-9 Total Score 19 14 15           ROS: Negative unless specifically indicated above in HPI.   Relevant past medical history reviewed and updated as indicated.   Allergies and medications reviewed and updated.   Current Outpatient Medications:    sertraline  (ZOLOFT ) 50 MG tablet, Take 1 tablet (50 mg total) by mouth daily., Disp: 30 tablet, Rfl: 1  Allergies  Allergen Reactions   Depo-Provera  [Medroxyprogesterone  Acetate] Other (See Comments)    Heavy periods heavier than usual causing anemia   Latex Rash    Objective:   BP 104/64 (BP Location: Left Arm, Patient Position: Sitting, Cuff Size: Normal)   Pulse 91   Temp 98.4 F (36.9 C) (Temporal)   Ht 5\' 1"  (1.549 m)   Wt 156 lb (70.8 kg)   SpO2 97%   BMI 29.48 kg/m    Physical Exam Constitutional:      General: She is not in acute distress.    Appearance: Normal appearance. She is normal weight. She is not  ill-appearing, toxic-appearing or diaphoretic.  HENT:     Head: Normocephalic.  Cardiovascular:     Rate and Rhythm: Normal rate.  Pulmonary:     Effort: Pulmonary effort is normal.  Musculoskeletal:        General: Normal range of motion.  Neurological:     General: No focal deficit present.     Mental Status: She is alert and oriented to person, place, and time. Mental status is at baseline.  Psychiatric:        Mood and Affect: Mood normal.        Behavior: Behavior normal.        Thought Content: Thought content normal.        Judgment: Judgment normal.     Assessment & Plan:  Adjustment disorder with mixed anxiety and depressed mood Assessment & Plan: Pt agreeable to start medications Recommending therapy with CBT referral placed  Pt giving options for safety plan.   I instructed pt to start sertraline  1/2 tablet once daily for 1 week and then increase to a full tablet once daily on week two as tolerated.  We discussed common side effects such as nausea, drowsiness and weight gain.  Also discussed rare but serious side effect of suicidal ideation.  She is instructed  to discontinue medication and go directly to ED if this occurs.  Pt verbalizes understanding.  Plan is to follow up in 30 days to evaluate progress.     Orders: -     Ambulatory referral to Psychology -     Sertraline  HCl; Take 1 tablet (50 mg total) by mouth daily.  Dispense: 30 tablet; Refill: 1  GAD (generalized anxiety disorder) -     Ambulatory referral to Psychology -     Sertraline  HCl; Take 1 tablet (50 mg total) by mouth daily.  Dispense: 30 tablet; Refill: 1  Trauma and stressor-related disorder -     Ambulatory referral to Psychology  Encounter for counseling regarding contraception Assessment & Plan: Discussed options Pt would like IUD we do not place these here however will refer to gynecology  Discussed condom usage with sex and how to prevent pregnancy    Orders: -     Ambulatory  referral to Obstetrics / Gynecology     Follow up plan: Return in about 6 weeks (around 09/17/2023) for f/u CPE.  Felicita Horns, FNP

## 2023-08-13 ENCOUNTER — Encounter (HOSPITAL_COMMUNITY): Payer: Self-pay | Admitting: Emergency Medicine

## 2023-08-13 ENCOUNTER — Other Ambulatory Visit: Payer: Self-pay

## 2023-08-13 ENCOUNTER — Emergency Department (HOSPITAL_COMMUNITY)

## 2023-08-13 ENCOUNTER — Emergency Department (HOSPITAL_COMMUNITY)
Admission: EM | Admit: 2023-08-13 | Discharge: 2023-08-13 | Disposition: A | Discharge status: Home / Self Care | Attending: Emergency Medicine | Admitting: Emergency Medicine

## 2023-08-13 DIAGNOSIS — R0789 Other chest pain: Secondary | ICD-10-CM | POA: Diagnosis not present

## 2023-08-13 DIAGNOSIS — D72829 Elevated white blood cell count, unspecified: Secondary | ICD-10-CM | POA: Diagnosis not present

## 2023-08-13 DIAGNOSIS — R079 Chest pain, unspecified: Secondary | ICD-10-CM | POA: Diagnosis present

## 2023-08-13 DIAGNOSIS — Z91040 Latex allergy status: Secondary | ICD-10-CM | POA: Diagnosis not present

## 2023-08-13 DIAGNOSIS — R072 Precordial pain: Secondary | ICD-10-CM | POA: Diagnosis not present

## 2023-08-13 DIAGNOSIS — M546 Pain in thoracic spine: Secondary | ICD-10-CM | POA: Insufficient documentation

## 2023-08-13 LAB — BASIC METABOLIC PANEL WITH GFR
Anion gap: 11 (ref 5–15)
BUN: 7 mg/dL (ref 6–20)
CO2: 20 mmol/L — ABNORMAL LOW (ref 22–32)
Calcium: 9.7 mg/dL (ref 8.9–10.3)
Chloride: 107 mmol/L (ref 98–111)
Creatinine, Ser: 0.69 mg/dL (ref 0.44–1.00)
GFR, Estimated: >60 mL/min (ref >60)
Glucose, Bld: 93 mg/dL (ref 70–99)
Potassium: 3.8 mmol/L (ref 3.5–5.1)
Sodium: 138 mmol/L (ref 135–145)

## 2023-08-13 LAB — CBC
HCT: 38.9 % (ref 36.0–46.0)
Hemoglobin: 13.1 g/dL (ref 12.0–15.0)
MCH: 30.8 pg (ref 26.0–34.0)
MCHC: 33.7 g/dL (ref 30.0–36.0)
MCV: 91.5 fL (ref 80.0–100.0)
Platelets: 289 10*3/uL (ref 150–400)
RBC: 4.25 MIL/uL (ref 3.87–5.11)
RDW: 13.1 % (ref 11.5–15.5)
WBC: 16.3 10*3/uL — ABNORMAL HIGH (ref 4.0–10.5)
nRBC: 0 % (ref 0.0–0.2)

## 2023-08-13 LAB — D-DIMER, QUANTITATIVE: D-Dimer, Quant: 0.43 ug{FEU}/mL (ref 0.00–0.50)

## 2023-08-13 LAB — HCG, SERUM, QUALITATIVE: Preg, Serum: NEGATIVE

## 2023-08-13 LAB — TROPONIN I (HIGH SENSITIVITY)
Troponin I (High Sensitivity): 3 ng/L (ref <18)
Troponin I (High Sensitivity): <2 ng/L (ref <18)

## 2023-08-13 MED ORDER — IBUPROFEN 400 MG PO TABS
600.0000 mg | ORAL_TABLET | Freq: Once | ORAL | Status: AC
  Administered 2023-08-13: 600 mg via ORAL
  Filled 2023-08-13: qty 1

## 2023-08-13 NOTE — Discharge Instructions (Signed)
 Your chest pain is consistent with costochondritis (inflammation of the chest wall) and likely muscle strain of your upper back.  You are treated with ibuprofen  600 mg. You can take ibuprofen  400 to 600 mg every 6 hours as needed and follow-up with your PCP if symptoms do not improve or worsen

## 2023-08-13 NOTE — ED Provider Notes (Signed)
 [place] EMERGENCY DEPARTMENT AT [facility] Provider Note   CSN: [id number] Arrival date & time: 08/13/23  1116     History Anxiety and depression Chief Complaint  Patient presents with   Chest Pain    Teresa Rogers is a 23 y.o. female.  The patient presents with mid chest and upper L sided back pain.  She is accompanied by her father.  The pains began at 5:30 AM, worsening with deep breathing and varying in intensity.  The left upper back pain is worse with arm movement.  After returning to sleep, the pain persisted upon waking at 9:00 AM.  She has never had pain like this before.   She does not recall any injuries but has been seen at [employer] and doing a lot of motions with her arms. There are no associated symptoms such as fever, cough, congestion, runny nose, sore throat, diarrhea, vomiting, lightheadedness, or headache. A history of panic attacks is noted, but this pain does not resemble those episodes. Zoloft  was started a week ago and is taken consistently. No family history of blood clots and not on birth control.   Chest Pain      Home Medications Prior to Admission medications   Medication Sig Start Date End Date Taking? Authorizing Provider  sertraline  (ZOLOFT ) 50 MG tablet Take 1 tablet (50 mg total) by mouth daily. 08/06/23   Dugal, Tabitha, FNP      Allergies    Depo-provera  [medroxyprogesterone  acetate] and Latex    Review of Systems   Review of Systems  Cardiovascular:  Positive for chest pain.  As in HPI  Physical Exam Updated Vital Signs BP 111/79 (BP Location: Left Arm)   Pulse 72   Temp 98.8 F (37.1 C) (Oral)   Resp 16   Ht 5\' 1"  (1.549 m)   Wt 70.8 kg   SpO2 100%   BMI 29.49 kg/m  Physical Exam Constitutional:      General: She is not in acute distress.    Appearance: She is not ill-appearing.  Cardiovascular:     Rate and Rhythm: Normal rate and regular rhythm.     Heart sounds: No murmur heard. Pulmonary:      Effort: Pulmonary effort is normal. No respiratory distress.     Breath sounds: Normal breath sounds.  Chest:     Comments: Mild tenderness palpation of sternum Abdominal:     Palpations: Abdomen is soft.     Tenderness: There is no abdominal tenderness.  Musculoskeletal:     Cervical back: Neck supple.     Right lower leg: No tenderness. No edema.     Left lower leg: No tenderness. No edema.     Comments: Mild tenderness and tension to right sided thoracic paraspinal muscles and traps  Skin:    General: Skin is warm and dry.  Neurological:     General: No focal deficit present.     Mental Status: She is alert.  Psychiatric:        Mood and Affect: Mood normal.        Behavior: Behavior normal.     ED Results / Procedures / Treatments   Labs (all labs ordered are listed, but only abnormal results are displayed) Labs Reviewed  BASIC METABOLIC PANEL WITH GFR - Abnormal; Notable for the following components:      Result Value   CO2 20 (*)    All other components within normal limits  CBC - Abnormal; Notable for  the following components:   WBC 16.3 (*)    All other components within normal limits  HCG, SERUM, QUALITATIVE  TROPONIN I (HIGH SENSITIVITY)  TROPONIN I (HIGH SENSITIVITY)    EKG None  Radiology DG Chest 2 View Result Date: 08/13/2023 CLINICAL DATA:  Chest pain EXAM: CHEST - 2 VIEW COMPARISON:  None Available. FINDINGS: The heart size and mediastinal contours are within normal limits. Both lungs are clear. The visualized skeletal structures are unremarkable. Bilateral radiopaque nipple piercings noted. IMPRESSION: No active cardiopulmonary disease. Electronically Signed   By: Melven Stable.  Shick M.D.   On: 08/13/2023 13:26    Procedures Procedures  None  Medications Ordered in ED Medications - No data to display  ED Course/ Medical Decision Making/ A&P                                Medical Decision Making 23 year old female presents with chest and back pain  which started this morning.  The chest pain is central describes that sharp pain in the back pain is right-sided thoracic pain.  States the chest pain is worse when taking a deep breath in the pain is worse with arm movement.  In ED, she was initially mildly tachycardic which resolved and vitals otherwise stable. Labs showing leukocytosis to 16.3.  BMP nonconcerning, D-dimer negative and troponin of 3. EKG showing sinus tachycardia.  Given age, no ST changes on EKG and low troponin, MI ruled out.  Low concern for PE given negative D-dimer, breathing comfortably with normal SpO2 and tachycardia resolved. Lungs CTAB and CXR clear with low concern for pneumonia.  No fever or other symptoms concerning for infection. Most likely diagnosis is costochondritis and upper back muscular strain due to repetitive motions at work which could also cause leukocytosis.  She remained stable throughout ED visit did not require admission.  She was treated with 1 dose ibuprofen  advised that she can take OTC ibuprofen  as needed for pain and follow-up with her PCP.    Amount and/or Complexity of Data Reviewed Labs: ordered. Radiology: ordered.   Final Clinical Impression(s) / ED Diagnoses Final diagnoses:  None    Rx / DC Orders ED Discharge Orders     None         Shalanda, Brogden, DO 08/13/23 1448    Quinn Bucco, DO 08/27/23 2107

## 2023-08-13 NOTE — ED Triage Notes (Addendum)
 Pt came in for 7/10  back and chest pain r/t deep breaths  PT woke up with these symptoms. Pt was ST at 110 on ECG.NO obvious distress at this time.

## 2023-09-06 ENCOUNTER — Encounter: Admitting: Family

## 2023-09-11 ENCOUNTER — Ambulatory Visit: Admitting: Family

## 2023-09-12 ENCOUNTER — Ambulatory Visit: Admitting: Family

## 2023-09-28 ENCOUNTER — Other Ambulatory Visit: Payer: Self-pay | Admitting: Family

## 2023-09-28 DIAGNOSIS — F411 Generalized anxiety disorder: Secondary | ICD-10-CM

## 2023-09-28 DIAGNOSIS — F4323 Adjustment disorder with mixed anxiety and depressed mood: Secondary | ICD-10-CM

## 2023-10-10 ENCOUNTER — Other Ambulatory Visit (HOSPITAL_COMMUNITY)
Admission: RE | Admit: 2023-10-10 | Discharge: 2023-10-10 | Disposition: A | Discharge status: Home / Self Care | Source: Ambulatory Visit | Attending: Certified Nurse Midwife | Admitting: Certified Nurse Midwife

## 2023-10-10 ENCOUNTER — Ambulatory Visit (INDEPENDENT_AMBULATORY_CARE_PROVIDER_SITE_OTHER): Admitting: Certified Nurse Midwife

## 2023-10-10 ENCOUNTER — Encounter: Payer: Self-pay | Admitting: Certified Nurse Midwife

## 2023-10-10 VITALS — BP 112/74 | HR 92 | Ht 61.0 in | Wt 164.0 lb

## 2023-10-10 DIAGNOSIS — Z3009 Encounter for other general counseling and advice on contraception: Secondary | ICD-10-CM

## 2023-10-10 DIAGNOSIS — Z124 Encounter for screening for malignant neoplasm of cervix: Secondary | ICD-10-CM

## 2023-10-10 DIAGNOSIS — Z113 Encounter for screening for infections with a predominantly sexual mode of transmission: Secondary | ICD-10-CM | POA: Diagnosis not present

## 2023-10-10 DIAGNOSIS — N390 Urinary tract infection, site not specified: Secondary | ICD-10-CM

## 2023-10-12 LAB — CERVICOVAGINAL ANCILLARY ONLY
Chlamydia: NEGATIVE
Comment: NEGATIVE
Comment: NEGATIVE
Comment: NORMAL
Neisseria Gonorrhea: NEGATIVE
Trichomonas: NEGATIVE

## 2023-10-13 NOTE — Progress Notes (Signed)
 GYNECOLOGY OFFICE VISIT NOTE  History:   Teresa Rogers is an employed  23 y.o. G1P0010 here today to establish care and discuss birth control options. Patient reports regular 21-25 day cycles with a 7 day bleed.  Day 4-7 are typically heavy days. Uses tampons as her collection method. Changes often but not due to being heavily saturated. Headaches during the time of period that are easily relieved with PO Ibuprofen . She is currently sexually active with female partner(s). Endorses vaping but denies MJ or CBD use. Planning Pregnancy 69-53 years of age. Previously used depo and experienced irregular bleeding planing IUD. She denies any abnormal vaginal discharge, bleeding, pelvic pain or other concerns.     Past Medical History:  Diagnosis Date   Depression    Frequent headaches    Migraines    Ovarian cyst    Seasonal allergies    Urinary tract infection    UTI (urinary tract infection)     Past Surgical History:  Procedure Laterality Date   TONSILLECTOMY      The following portions of the patient's history were reviewed and updated as appropriate: allergies, current medications, past family history, past medical history, past social history, past surgical history and problem list.   Health Maintenance:  Patient needs a Pap, however would like to have it done when she comes back to have her IUD placed. .   Review of Systems:  Pertinent items noted in HPI and remainder of comprehensive ROS otherwise negative.  Physical Exam:  BP 112/74   Pulse 92   Ht 5' 1 (1.549 m)   Wt 164 lb (74.4 kg)   LMP 09/18/2023   BMI 30.99 kg/m  CONSTITUTIONAL: Well-developed, well-nourished female in no acute distress.  HEENT:  Normocephalic, atraumatic. External right and left ear normal. No scleral icterus.  NECK: Normal range of motion, supple, no masses noted on observation SKIN: No rash noted. Not diaphoretic. No erythema. No pallor. MUSCULOSKELETAL: Normal range of motion. No edema  noted. NEUROLOGIC: Alert and oriented to person, place, and time. Normal muscle tone coordination. No cranial nerve deficit noted. PSYCHIATRIC: Normal mood and affect. Normal behavior. Normal judgment and thought content. CARDIOVASCULAR: Normal heart rate noted RESPIRATORY: Effort and breath sounds normal, no problems with respiration noted ABDOMEN: No masses noted. No other overt distention noted.   PELVIC: Deferred  Labs and Imaging Results for orders placed or performed in visit on 10/10/23 (from the past week)  Cervicovaginal ancillary only( [place])   Collection Time: 10/10/23  3:40 PM  Result Value Ref Range   Neisseria Gonorrhea Negative    Chlamydia Negative    Trichomonas Negative    Comment Normal Reference Range Trichomonas - Negative    Comment Normal Reference [place] Chlamydia - Negative    Comment      Normal Reference Range Neisseria Gonorrhea - Negative   No results found.    Assessment and Plan:    1. Pap smear for cervical cancer screening (Primary) - Deferred until next visit per patient request   2. Birth control counseling - Reviewed all types of birth control with patient. Discussed R/B/A of each, patient leaning into a IUD. Reviewed Paragard versus Litetta or Mirena. Patient desires Paragard, IUD.  - Follow up in 3 weeks for placement.   3. Screening for STDs (sexually transmitted diseases) - STD screening performed at patient request  - Ambulatory referral to Integrated [facility] - Cervicovaginal ancillary only( [place])  Routine preventative health maintenance measures emphasized.  Please refer to After Visit Summary for other counseling recommendations.   Return in about 4 weeks (around 11/07/2023) for IUD insertion .    I spent 30 minutes dedicated to the care of this patient including pre-visit review of records, face to face time with the patient discussing her conditions and treatments and post visit orders.    Wojciech Willetts Erven)  Emilio, MSN, CNM  [facility]  10/13/23 10:27 AM

## 2023-10-25 ENCOUNTER — Encounter: Admitting: Family

## 2023-12-17 ENCOUNTER — Encounter: Admitting: Family

## 2024-01-30 ENCOUNTER — Encounter: Payer: Self-pay | Admitting: Certified Nurse Midwife

## 2024-01-30 ENCOUNTER — Ambulatory Visit (INDEPENDENT_AMBULATORY_CARE_PROVIDER_SITE_OTHER): Admitting: Certified Nurse Midwife

## 2024-01-30 ENCOUNTER — Other Ambulatory Visit (HOSPITAL_COMMUNITY)
Admission: RE | Admit: 2024-01-30 | Discharge: 2024-01-30 | Disposition: A | Discharge status: Home / Self Care | Source: Ambulatory Visit | Attending: Certified Nurse Midwife | Admitting: Certified Nurse Midwife

## 2024-01-30 VITALS — BP 114/73 | HR 76 | Ht 61.0 in | Wt 166.0 lb

## 2024-01-30 DIAGNOSIS — Z01419 Encounter for gynecological examination (general) (routine) without abnormal findings: Secondary | ICD-10-CM

## 2024-01-30 DIAGNOSIS — Z113 Encounter for screening for infections with a predominantly sexual mode of transmission: Secondary | ICD-10-CM

## 2024-01-30 DIAGNOSIS — Z30430 Encounter for insertion of intrauterine contraceptive device: Secondary | ICD-10-CM

## 2024-01-30 DIAGNOSIS — Z3202 Encounter for pregnancy test, result negative: Secondary | ICD-10-CM

## 2024-01-30 DIAGNOSIS — Z3009 Encounter for other general counseling and advice on contraception: Secondary | ICD-10-CM

## 2024-01-30 LAB — POCT URINE PREGNANCY: Preg Test, Ur: NEGATIVE

## 2024-01-30 MED ORDER — PARAGARD INTRAUTERINE COPPER IU IUD
1.0000 | INTRAUTERINE_SYSTEM | Freq: Once | INTRAUTERINE | Status: AC
  Administered 2024-01-30: 1 via INTRAUTERINE

## 2024-01-30 NOTE — Progress Notes (Addendum)
   GYNECOLOGY CLINIC PROCEDURE NOTE   Teresa Rogers is a 23 y.o. G1P0010 here for Paragard IUD insertion. No GYN concerns.  Last pap smear was on completed today.   IUD Insertion Procedure Note Patient identified, informed consent performed, consent signed.   Discussed risks of irregular bleeding, cramping, infection, malpositioning or misplacement of the IUD outside the uterus which may require further procedure such as laparoscopy. Time out was performed.  Urine pregnancy test negative.   Speculum placed in the vagina.  Cervix visualized.  Cleaned with Betadine x 3.  Grasped anteriorly with a single tooth tenaculum.  Uterus sounded to 8 cm.  Paragard IUD placed per manufacturer's recommendations.  Strings trimmed to 3 cm. Tenaculum was removed, good hemostasis noted.  Patient tolerated procedure well.    Patient was given post-procedure instructions.  She was advised to have backup contraception for one week.  Patient was also asked to check IUD strings periodically and follow up in 4 weeks for IUD check.  IUD inserted without difficulty.  LOT # [id number] EXP: 10/2025  Derrek JINNY Freund, NP Student 01/30/24 8:30 am

## 2024-01-30 NOTE — Progress Notes (Addendum)
   Subjective:     Teresa Rogers is a 23 y.o. female here at [facility] for a routine exam.  Current complaints: Patient has no concerns today. Patient reports recent unprotected intercourse 10/24 and last night which was protected per patient.  Personal and family health history reviewed: yes.  Do you have a primary care provider? Yes Do you feel safe at home? Yes  Flowsheet Row Office Visit from 10/10/2023 in [facility] for [facility]  PHQ-2 Total Score 3    Health Maintenance Due  Topic Date Due   COVID-19 Vaccine (1) Never done   Meningococcal B Vaccine (1 of 2 - Standard) Never done   HPV VACCINES (2 - 3-dose series) 05/17/2018   Cervical Cancer Screening (Pap smear)  Never done   DTaP/Tdap/Td (8 - Td or Tdap) 11/03/2022   Influenza Vaccine  10/26/2023     Risk factors for chronic health problems: None Smoking: Vape estimated about 100 hits per day Alchohol/how much: none Pt BMI: Body mass index is 31.37 kg/m.   Gynecologic History Patient's last menstrual period was 01/10/2024. Contraception: Considering Paragard IUD. Will Defer due to recent intercourse Last Pap: First Pap Today. Results were: N/A Last mammogram: N/A. Results were: N/A  Obstetric History OB History  Gravida Para Term Preterm AB Living  1 0   1   SAB IAB Ectopic Multiple Live Births  1        # Outcome Date GA Lbr Len/2nd Weight Sex Type Anes PTL Lv  1 SAB 04/12/22             The following portions of the patient's history were reviewed and updated as appropriate: allergies, current medications, past family history, past medical history, past social history, past surgical history, and problem list.  Review of Systems Pertinent items are noted in HPI. Pertinent items noted in HPI and remainder of comprehensive ROS otherwise negative.    Objective:   Today's Vitals   01/30/24 0821  BP: 114/73  Pulse: 76  Weight: 75.3 kg  Height: 5' 1 (1.549 m)    Body mass index is 31.37 kg/m.  VS reviewed, nursing note reviewed,  Constitutional: well developed, well nourished, no distress HEENT: normocephalic, thyroid without enlargement or mass HEART: RRR, no murmurs rubs/gallops RESP: clear and equal to auscultation bilaterally in all lobes  Breast Exam:  exam performed: right breast normal without mass, skin or nipple changes or axillary nodes, left breast normal without mass, skin or nipple changes or axillary nodes Abdomen: soft Neuro: alert and oriented x 3 Skin: warm, dry Psych: affect normal Pelvic exam: Performed: without lesion, scant white creamy discharge, vaginal walls and external genitalia normal Bimanual exam: neg CMT, uterus nontender, nonenlarged, adnexa without tenderness, enlargement, or mass      Assessment/Plan:   1. Well woman exam (Primary) -Follow up 1 year   2. Birth control counseling Paragard IUD Inserted today -Counseled patient on Paragard being used for emergency contraception in the case that there is a pregnancy in process a Prargard may disrupt the pregnancy. Patient consented to proceed with the insertion at this time.  3. Encounter for IUD insertion -Paragard IUD inserted without difficulty -See Procedure note     Return in about 4 weeks (around 02/27/2024) for String Check.   Derrek JINNY Freund, NP Student 8:37 AM

## 2024-01-30 NOTE — Progress Notes (Signed)
 Patient presents for Annual.  LMP: 01/10/2024 Last pap: ? Contraception: None Mammogram: Not yet indicated Family Hx of  STD Screening: Accepts  CC: Annual/None

## 2024-01-31 LAB — CYTOLOGY - PAP
Adequacy: ABSENT
Chlamydia: NEGATIVE
Comment: NEGATIVE
Comment: NEGATIVE
Comment: NEGATIVE
Comment: NORMAL
Diagnosis: NEGATIVE
High risk HPV: NEGATIVE
Neisseria Gonorrhea: NEGATIVE
Trichomonas: NEGATIVE

## 2024-01-31 LAB — RPR+HBSAG+HCVAB+...
HIV Screen 4th Generation wRfx: NONREACTIVE
Hep C Virus Ab: NONREACTIVE
Hepatitis B Surface Ag: NEGATIVE
RPR Ser Ql: NONREACTIVE

## 2024-02-18 ENCOUNTER — Other Ambulatory Visit: Payer: Self-pay | Admitting: Family

## 2024-02-18 DIAGNOSIS — F4323 Adjustment disorder with mixed anxiety and depressed mood: Secondary | ICD-10-CM

## 2024-02-18 DIAGNOSIS — F411 Generalized anxiety disorder: Secondary | ICD-10-CM

## 2024-03-05 ENCOUNTER — Ambulatory Visit: Admitting: Certified Nurse Midwife

## 2025-03-25 ENCOUNTER — Ambulatory Visit: Admitting: Family Medicine
